# Patient Record
Sex: Female | Born: 1945 | Race: White | Hispanic: No | Marital: Married | Smoking: Never smoker
Health system: Southern US, Community
[De-identification: ages and names within clinical notes are randomized; demographics above are authoritative.]

## PROBLEM LIST (undated history)

## (undated) DIAGNOSIS — E0590 Thyrotoxicosis, unspecified without thyrotoxic crisis or storm: Secondary | ICD-10-CM

## (undated) DIAGNOSIS — N390 Urinary tract infection, site not specified: Secondary | ICD-10-CM

## (undated) DIAGNOSIS — T7840XA Allergy, unspecified, initial encounter: Secondary | ICD-10-CM

## (undated) DIAGNOSIS — H269 Unspecified cataract: Secondary | ICD-10-CM

## (undated) DIAGNOSIS — F419 Anxiety disorder, unspecified: Secondary | ICD-10-CM

## (undated) DIAGNOSIS — M1711 Unilateral primary osteoarthritis, right knee: Secondary | ICD-10-CM

## (undated) DIAGNOSIS — M810 Age-related osteoporosis without current pathological fracture: Secondary | ICD-10-CM

## (undated) DIAGNOSIS — E119 Type 2 diabetes mellitus without complications: Secondary | ICD-10-CM

## (undated) DIAGNOSIS — I10 Essential (primary) hypertension: Secondary | ICD-10-CM

## (undated) DIAGNOSIS — K219 Gastro-esophageal reflux disease without esophagitis: Secondary | ICD-10-CM

## (undated) DIAGNOSIS — M1990 Unspecified osteoarthritis, unspecified site: Secondary | ICD-10-CM

## (undated) HISTORY — DX: Allergy, unspecified, initial encounter: T78.40XA

## (undated) HISTORY — DX: Type 2 diabetes mellitus without complications: E11.9

## (undated) HISTORY — PX: ABDOMINAL HYSTERECTOMY: SHX81

## (undated) HISTORY — DX: Unspecified cataract: H26.9

## (undated) HISTORY — PX: EYE SURGERY: SHX253

## (undated) HISTORY — DX: Unspecified osteoarthritis, unspecified site: M19.90

## (undated) HISTORY — PX: TONSILLECTOMY: SUR1361

## (undated) HISTORY — DX: Age-related osteoporosis without current pathological fracture: M81.0

## (undated) HISTORY — DX: Essential (primary) hypertension: I10

---

## 1994-10-25 HISTORY — PX: KNEE SURGERY: SHX244

## 1996-10-25 HISTORY — PX: KNEE SURGERY: SHX244

## 1998-03-21 ENCOUNTER — Other Ambulatory Visit: Admission: RE | Admit: 1998-03-21 | Discharge: 1998-03-21 | Payer: Self-pay | Admitting: Obstetrics and Gynecology

## 1999-06-03 ENCOUNTER — Other Ambulatory Visit: Admission: RE | Admit: 1999-06-03 | Discharge: 1999-06-03 | Payer: Self-pay | Admitting: Obstetrics and Gynecology

## 1999-07-21 ENCOUNTER — Observation Stay (HOSPITAL_COMMUNITY): Admission: RE | Admit: 1999-07-21 | Discharge: 1999-07-22 | Payer: Self-pay | Admitting: Urology

## 2000-02-22 ENCOUNTER — Other Ambulatory Visit: Admission: RE | Admit: 2000-02-22 | Discharge: 2000-02-22 | Payer: Self-pay | Admitting: Obstetrics and Gynecology

## 2000-03-01 ENCOUNTER — Other Ambulatory Visit: Admission: RE | Admit: 2000-03-01 | Discharge: 2000-03-01 | Payer: Self-pay | Admitting: Obstetrics and Gynecology

## 2000-03-01 ENCOUNTER — Encounter (INDEPENDENT_AMBULATORY_CARE_PROVIDER_SITE_OTHER): Payer: Self-pay | Admitting: Specialist

## 2000-04-29 ENCOUNTER — Encounter (INDEPENDENT_AMBULATORY_CARE_PROVIDER_SITE_OTHER): Payer: Self-pay

## 2000-04-29 ENCOUNTER — Inpatient Hospital Stay (HOSPITAL_COMMUNITY): Admission: RE | Admit: 2000-04-29 | Discharge: 2000-05-01 | Payer: Self-pay | Admitting: Obstetrics and Gynecology

## 2001-03-29 ENCOUNTER — Other Ambulatory Visit: Admission: RE | Admit: 2001-03-29 | Discharge: 2001-03-29 | Payer: Self-pay | Admitting: Obstetrics and Gynecology

## 2001-07-11 ENCOUNTER — Encounter: Payer: Self-pay | Admitting: Orthopedic Surgery

## 2001-07-11 ENCOUNTER — Encounter: Admission: RE | Admit: 2001-07-11 | Discharge: 2001-07-11 | Payer: Self-pay | Admitting: Orthopedic Surgery

## 2002-08-23 ENCOUNTER — Encounter: Admission: RE | Admit: 2002-08-23 | Discharge: 2002-08-23 | Payer: Self-pay | Admitting: Family Medicine

## 2002-08-23 ENCOUNTER — Encounter: Payer: Self-pay | Admitting: Family Medicine

## 2003-11-28 ENCOUNTER — Encounter: Admission: RE | Admit: 2003-11-28 | Discharge: 2003-11-28 | Payer: Self-pay | Admitting: Internal Medicine

## 2003-12-11 ENCOUNTER — Other Ambulatory Visit: Admission: RE | Admit: 2003-12-11 | Discharge: 2003-12-11 | Payer: Self-pay | Admitting: Obstetrics and Gynecology

## 2005-08-31 ENCOUNTER — Other Ambulatory Visit: Admission: RE | Admit: 2005-08-31 | Discharge: 2005-08-31 | Payer: Self-pay | Admitting: Obstetrics and Gynecology

## 2005-09-20 ENCOUNTER — Encounter: Admission: RE | Admit: 2005-09-20 | Discharge: 2005-09-20 | Payer: Self-pay | Admitting: Obstetrics and Gynecology

## 2007-03-14 ENCOUNTER — Other Ambulatory Visit: Admission: RE | Admit: 2007-03-14 | Discharge: 2007-03-14 | Payer: Self-pay | Admitting: Obstetrics and Gynecology

## 2007-05-30 ENCOUNTER — Encounter: Admission: RE | Admit: 2007-05-30 | Discharge: 2007-05-30 | Payer: Self-pay | Admitting: Obstetrics and Gynecology

## 2007-06-01 ENCOUNTER — Encounter: Admission: RE | Admit: 2007-06-01 | Discharge: 2007-06-01 | Payer: Self-pay | Admitting: Obstetrics and Gynecology

## 2010-07-30 ENCOUNTER — Encounter: Admission: RE | Admit: 2010-07-30 | Discharge: 2010-07-30 | Payer: Self-pay | Admitting: Family Medicine

## 2011-03-12 NOTE — Op Note (Signed)
[facility]  Patient:    Sharon Ochoa, Sharon Ochoa                   MRN: [id number] Proc. Date: 04/29/00 Adm. Date:  [id number] Attending:  Wandalee Ferdinand                           Operative Report  PREOPERATIVE DIAGNOSES:       1. Pelvic relaxation.                               2. Rectocele.                               3. Endometrial hyperplasia without atypia.  POSTOPERATIVE DIAGNOSES:      1. Pelvic relaxation.                               2. Rectocele.                               3. Endometrial hyperplasia without atypia,                                  pathology pending.  OPERATION:                    1. Total vaginal hysterectomy.                               2. Posterior colporrhaphy.  SURGEON:                      Rudy Jew. Ashley Royalty, M.D.  ASSISTANT:                    Jamison Neighbor, M.D.  ESTIMATED BLOOD LOSS:         75 cc.  COMPLICATIONS:                None.  PACKS AND DRAINS:             1. Foley.                               2. Vaginal pack.  DESCRIPTION OF PROCEDURE:     The patient was taken to the operating room and placed in the dorsal supine position.  After adequate general endotracheal anesthesia was administered, she was placed in the lithotomy position and prepped and draped in the usual manner for vaginal surgery.  A Foley catheter was placed.  A posterior weighted retractor was placed, and the anterior lip of the cervix was grasped with a Jacobs tenaculum.  Approximately 10 cc of 1% Xylocaine with 1:100,000 epinephrine was instilled circumferentially around the cervix to aid in hemostasis.  The anterior aspect of the cervix was circumscribed with a knife.  A posterior colpotomy incision was successfully made.  The Steiner-Avard retractor was placed in the posterior cul-de-sac. The uterosacral ligaments were bilaterally clamped, cut, and secured with #1 chromic catgut.  Next, the bladder was  dissected  cephalad using sharp and blunt dissection and the anterior cul-de-sac successfully entered.  The cardinal ligaments were then clamped, cut, and secured with #1 chromic catgut. The uterine vessels were clamped, cut, and doubly secured with #1 chromic catgut.  One additional bite was taken on either side to further secure the parametrial tissues.  At this point, the posterior aspect of the corpus was grasped with a single-tooth tenaculum and the uterus inverted.  The pedicles consisting of the fallopian tube, utero-ovarian anastomoses, and round ligaments were clamped, cut, and doubly secured with #1 chromic catgut.  The specimen was submitted to pathology for histologic studies.  The pedicles were inspected and found to be hemostatic.  Next, a running locking 2-0 Vicryl suture was placed from 3 to 9 oclock posteriorly to aid in hemostasis of the vaginal cuff.  This was successfully placed.  Next, a modified McCall stitch was placed from uterosacral ligament to uterosacral ligament posteriorly on the peritoneum to further plicate the pelvic tissues and avoid enterocele formation.  Hemostasis was noted.  The area was thoroughly irrigated, then. The vagina was then closed anteriorly to posteriorly with #1 chromic catgut in a figure-of-eight fashion.  The hysterectomy portion of the procedure was thus terminated.  Attention was then turned to the rectocele.  First the operator placed his finger in the anus and failed to detect any evidence of enterocele.  The introitus was gauged, and a transverse incision was made along the posterior fourchette.  The posterior aspect of the vagina was undermined in the midline and incised cephalad up to nearly the level of the vaginal cuff.  The vaginal mucosa was sharply and bluntly dissected free from the underlying pararectal fascia.  The dissection was carried to within approximately 1 cm of the vaginal cuff.  Next, the rectum was plicated using transverse  pursestring sutures of 2-0 Vicryl.  An excellent plication of the rectum was noted to have been achieved.  Excess vaginal mucosa was then excised.  The vaginal mucosa was then closed in running locking fashion, catching a small amount of pararectal fascia to aid in closure of dead space.  At the level of the introitus, the area was closed like an episiotomy to reduce the diameter of the introitus.  Hemostasis was noted.  At the conclusion of the procedure, approximately three of the operators fingers were easily presentable into the introitus.  Hemostasis was noted.  With the Foley catheter in place, a two-inch iodoform gauze was placed in the vagina to aid in postoperative hemostasis.  This will presumably be removed the first postoperative day.  The patient tolerated the procedure extremely well and was returned to the recovery room in good condition. DD:  04/29/00 TD:  04/29/00 Job: [id number]

## 2011-03-12 NOTE — H&P (Signed)
[facility]  Patient:    Sharon Ochoa, Sharon Ochoa                         MRN: [id number] Adm. Date:  04/28/00 Attending:  Fayrene Fearing A. Ashley Royalty, M.D.                         History and Physical  HISTORY OF PRESENT ILLNESS:   This is a 65 year old, gravida 4, para 4, who presented through the courtesy of her urologist, Eudelia Bunch for increasing symptoms of uterine prolapse.  She had a pubovaginal sling procedure as well as anterior colporrhaphy by Dr. Marcelyn Bruins previously.  At that time she wanted to keep her uterus and thus the pubovaginal procedure only was performed.  However, over time she has developed increasing symptoms of uterine prolapse and request surgical intervention.  Endometrial biopsy was performed Feb 29, 2000, and revealed endometrial hyperplasia, simple without atypia.  The patient was placed on Megace 20 mg daily.  MEDICATIONS:                  Relafen, nitrofurantoin, calcium.  PAST MEDICAL HISTORY:         Multinodular goiter with normal thyroid function, followed by Dr. Lucianne Muss.  Surgical:  T&A, D&C, left knee arthroscopic surgery, right knee, treatment of chondroplasia.  Anterior colporrhaphy and pubovaginal sling procedure September of 2000.  ALLERGIES:                    BACTRIM, ERYTHROMYCIN.  FAMILY HISTORY:               Positive for diabetes and coronary artery disease.  SOCIAL HISTORY:               The patient denies the use of tobacco or significant alcohol.  She is an [employer] fund[employer].  She is married.  REVIEW OF SYSTEMS:            Noncontributory.  PHYSICAL EXAMINATION:  GENERAL:                      A well-developed, well-nourished, pleasant white female in no acute distress.  VITAL SIGNS:                  Afebrile, vital signs stable.  SKIN:                         Warm and dry without lesions.  LYMPH NODES:                  There is no subclavicular, cervical or inguinal adenopathy.  HEENT:                         Normocephalic.  NECK:                         Supple without thyromegaly.  CHEST:                        Lungs are clear.  CARDIAC:                      Regular rate and rhythm without murmurs, gallops, or rubs.  BREASTS:  Examination deferred.  ABDOMEN:                      Soft and nontender without masses or organomegaly.  Bowel sounds are active.  MUSCULOSKELETAL:              Examination reveals full range of motion without edema, cyanosis, or CVA tenderness.  PELVIC:                       External genitalia within normal limits.  With Valsalva maneuver the cervix is visualized flushed with the level of the vulva.  Bimanual examination reveals uterus to be approximately 9 x 5 x 4 cm. No adnexal masses are palpable.  There is little if any cystocele present and a moderate retrocele.  There is no enterocele.  Rectovaginal examination confirms.  IMPRESSION:                   1. Pelvic relaxation with no significant stress                                  urinary incontinence.                               2. Rectocele.                               3. Status post pubovaginal sling and anterior                                  colporrhaphy per Dr. Logan Bores.  PLAN:                         Total vaginal hysterectomy with posterior colporrhaphy.  Risks, benefits and alternatives were fully discussed with the patient.  Questions invited and answered. DD:  04/28/00 TD:  04/28/00 Job: [id number]

## 2011-07-15 ENCOUNTER — Encounter: Payer: Self-pay | Admitting: Family Medicine

## 2011-07-15 ENCOUNTER — Ambulatory Visit (INDEPENDENT_AMBULATORY_CARE_PROVIDER_SITE_OTHER): Payer: Medicare Other | Admitting: Family Medicine

## 2011-07-15 VITALS — BP 146/89 | HR 74

## 2011-07-15 DIAGNOSIS — M2141 Flat foot [pes planus] (acquired), right foot: Secondary | ICD-10-CM

## 2011-07-15 DIAGNOSIS — M2140 Flat foot [pes planus] (acquired), unspecified foot: Secondary | ICD-10-CM

## 2011-07-15 NOTE — Progress Notes (Signed)
  Subjective:    Patient ID: Sharon Ochoa, female    DOB: October 12, 1946, 65 y.o.   MRN: [id number]  HPI Sharon Ochoa was complaining of left dorsal foot pain about 4 weeks ago. She was seen by Dr. Thurston Hole who recommended her to have orthotics made. Her foot pain has improve and she is pain free at this point. She would like to be evaluated and try orthotics if necessary. She has also been having B/L knee pain as well but she is asymptomatic from her knees at this visit.  There is no problem list on file for this patient. No current outpatient prescriptions on file prior to visit.   Allergies  Allergen Reactions  . Bactrim   . Erythromycin   . Macrodantin         Review of Systems  Constitutional: Negative for fever, chills and fatigue.  Musculoskeletal: Negative for back pain, joint swelling and gait problem.       Left foot pain about 1 month ago, now resolved  Neurological: Negative for tremors, weakness and numbness.       Objective:   Physical Exam  Constitutional: She appears well-developed and well-nourished.       BP 146/89  Pulse 74   Pulmonary/Chest: Effort normal.  Musculoskeletal:       Feet with intact skin. No swelling no hematomas. No TTP. Mild Flat feet .  No plantar callus. sensation intact distally. Gait independent without a limp.  Neurological: She is alert.  Skin: Skin is warm. No rash noted. No erythema. No pallor.  Psychiatric: She has a normal mood and affect.       Assessment & Plan:   1. Pes planus (flat feet)   sport  Insoles Scaphoid pads F/u in 6 weeks. If she feels that there is a benefit with the sport insoles and the scaphoid pads we recommend custom orthotics.

## 2012-03-16 DIAGNOSIS — H5210 Myopia, unspecified eye: Secondary | ICD-10-CM | POA: Insufficient documentation

## 2012-03-16 DIAGNOSIS — Z961 Presence of intraocular lens: Secondary | ICD-10-CM | POA: Insufficient documentation

## 2012-03-16 DIAGNOSIS — H11829 Conjunctivochalasis, unspecified eye: Secondary | ICD-10-CM | POA: Diagnosis present

## 2012-03-16 DIAGNOSIS — H02839 Dermatochalasis of unspecified eye, unspecified eyelid: Secondary | ICD-10-CM | POA: Insufficient documentation

## 2012-05-08 ENCOUNTER — Other Ambulatory Visit: Payer: Self-pay | Admitting: Family Medicine

## 2012-05-08 DIAGNOSIS — Z1231 Encounter for screening mammogram for malignant neoplasm of breast: Secondary | ICD-10-CM

## 2012-05-26 ENCOUNTER — Ambulatory Visit: Payer: No Typology Code available for payment source

## 2012-05-30 ENCOUNTER — Ambulatory Visit
Admission: RE | Admit: 2012-05-30 | Discharge: 2012-05-30 | Disposition: A | Discharge status: Home / Self Care | Payer: Medicare Other | Source: Ambulatory Visit | Attending: Family Medicine | Admitting: Family Medicine

## 2012-05-30 DIAGNOSIS — Z1231 Encounter for screening mammogram for malignant neoplasm of breast: Secondary | ICD-10-CM

## 2013-01-06 ENCOUNTER — Ambulatory Visit (INDEPENDENT_AMBULATORY_CARE_PROVIDER_SITE_OTHER): Payer: Medicare Other | Admitting: Emergency Medicine

## 2013-01-06 VITALS — BP 132/88 | HR 95 | Temp 98.7°F | Resp 17 | Ht 67.0 in | Wt 238.0 lb

## 2013-01-06 DIAGNOSIS — J020 Streptococcal pharyngitis: Secondary | ICD-10-CM

## 2013-01-06 MED ORDER — PENICILLIN V POTASSIUM 500 MG PO TABS: 500.0000 mg | ORAL_TABLET | Freq: Four times a day (QID) | ORAL | Status: DC

## 2013-01-06 NOTE — Progress Notes (Signed)
[facility] [address] [place] [address] [phone]  Date:  01/06/2013   Name:  Sharon Ochoa   DOB:  08/01/1946   MRN:  [id number]  PCP:  Pamelia Hoit, MD    Chief Complaint: Sore Throat   History of Present Illness:  Sharon Ochoa is a 67 y.o. very pleasant female patient who presents with the following:  Ill for four days with sore throat and fever.  Painful to swallow.  No trouble handling secretions.  No rash or sepsis.  No cough or coryza, nausea or vomiting, or stool change.  No improvement with over the counter medications or other home remedies. Denies other complaint or health concern today.   There is no problem list on file for this patient.   Past Medical History  Diagnosis Date  . Allergy   . Arthritis   . Cataract   . Diabetes mellitus without complication   . Hypertension   . Osteoporosis     Past Surgical History  Procedure Laterality Date  . Eye surgery    . Abdominal hysterectomy      History  Substance Use Topics  . Smoking status: Never Smoker   . Smokeless tobacco: Never Used  . Alcohol Use: No    Family History  Problem Relation Age of Onset  . Osteoporosis Mother   . Heart disease Father   . Osteoporosis Sister   . Ulcers Brother   . Asthma Brother   . Diabetes Maternal Grandmother     Allergies  Allergen Reactions  . Sulfa Antibiotics Other (See Comments)    Cramps, fever, chills   . Bactrim   . Erythromycin   . Macrodantin     Medication list has been reviewed and updated.  Current Outpatient Prescriptions on File Prior to Visit  Medication Sig Dispense Refill  . meloxicam (MOBIC) 15 MG tablet Take 15 mg by mouth daily.      Marland Kitchen omeprazole (PRILOSEC) 20 MG capsule Take 20 mg by mouth daily.      Marland Kitchen neomycin-polymyxin-hydrocortisone (CORTISPORIN) 3.5-10000-1 otic suspension        No current facility-administered medications on file prior to visit.    Review of Systems:  As per HPI,  otherwise negative.    Physical Examination: Filed Vitals:   01/06/13 1707  BP: 132/88  Pulse: 95  Temp: 98.7 F (37.1 C)  Resp: 17   Filed Vitals:   01/06/13 1707  Height: 5\' 7"  (1.702 m)  Weight: 238 lb (107.956 kg)   Body mass index is 37.27 kg/(m^2). Ideal Body Weight: Weight in (lb) to have BMI = 25: 159.3  GEN: WDWN, NAD, Non-toxic, A & O x 3 HEENT: Atraumatic, Normocephalic. Neck supple. No masses, No LAD.  Very erythematous throat. Ears and Nose: No external deformity. CV: RRR, No M/G/R. No JVD. No thrill. No extra heart sounds. PULM: CTA B, no wheezes, crackles, rhonchi. No retractions. No resp. distress. No accessory muscle use. ABD: S, NT, ND, +BS. No rebound. No HSM. EXTR: No c/c/e NEURO Normal gait.  PSYCH: Normally interactive. Conversant. Not depressed or anxious appearing.  Calm demeanor.    Assessment and Plan: Strep throat Pen Elon Jester, MD

## 2013-01-06 NOTE — Patient Instructions (Addendum)
Strep Infections  Streptococcal (strep) infections are caused by streptococcal germs (bacteria). Strep infections are very contagious. Strep infections can occur in:   Ears.   The nose.   The throat.   Sinuses.   Skin.   Blood.   Lungs.   Spinal fluid.   Urine.  Strep throat is the most common bacterial infection in children. The symptoms of a Strep infection usually get better in 2 to 3 days after starting medicine that kills germs (antibiotics). Strep is usually not contagious after 36 to 48 hours of antibiotic treatment. Strep infections that are not treated can cause serious complications. These include gland infections, throat abscess, rheumatic fever and kidney disease.  DIAGNOSIS   The diagnosis of strep is made by:   A culture for the strep germ.  TREATMENT   These infections require oral antibiotics for a full 10 days, an antibiotic shot or antibiotics given into the vein (intravenous, IV).  HOME CARE INSTRUCTIONS    Be sure to finish all antibiotics even if feeling better.   Only take over-the-counter medicines for pain, discomfort and or fever, as directed by your caregiver.   Close contacts that have a fever, sore throat or illness symptoms should see their caregiver right away.   You or your child may return to work, school or daycare if the fever and pain are better in 2 to 3 days after starting antibiotics.  SEEK MEDICAL CARE IF:    You or your child has an oral temperature above 102 F (38.9 C).   Your baby is older than 3 months with a rectal temperature of 100.5 F (38.1 C) or higher for more than 1 day.   You or your child is not better in 3 days.  SEEK IMMEDIATE MEDICAL CARE IF:    You or your child has an oral temperature above 102 F (38.9 C), not controlled by medicine.   Your baby is older than 3 months with a rectal temperature of 102 F (38.9 C) or higher.   Your baby is 3 months old or younger with a rectal temperature of 100.4 F (38 C) or higher.   There is a  spreading rash.   There is difficulty swallowing or breathing.   There is increased pain or swelling.  Document Released: 11/18/2004 Document Revised: 01/03/2012 Document Reviewed: 08/27/2009  ExitCare Patient Information 2013 [employer].

## 2013-10-25 DIAGNOSIS — E0590 Thyrotoxicosis, unspecified without thyrotoxic crisis or storm: Secondary | ICD-10-CM

## 2013-10-25 HISTORY — DX: Thyrotoxicosis, unspecified without thyrotoxic crisis or storm: E05.90

## 2014-04-02 ENCOUNTER — Other Ambulatory Visit (HOSPITAL_COMMUNITY): Payer: Self-pay | Admitting: Endocrinology

## 2014-04-02 DIAGNOSIS — E0590 Thyrotoxicosis, unspecified without thyrotoxic crisis or storm: Secondary | ICD-10-CM

## 2014-04-03 ENCOUNTER — Other Ambulatory Visit (HOSPITAL_COMMUNITY): Payer: Self-pay | Admitting: Endocrinology

## 2014-04-03 DIAGNOSIS — E0590 Thyrotoxicosis, unspecified without thyrotoxic crisis or storm: Secondary | ICD-10-CM

## 2014-04-18 ENCOUNTER — Encounter (HOSPITAL_COMMUNITY)
Admission: RE | Admit: 2014-04-18 | Discharge: 2014-04-18 | Disposition: A | Discharge status: Home / Self Care | Payer: Medicare Other | Source: Ambulatory Visit | Attending: Endocrinology | Admitting: Endocrinology

## 2014-04-18 DIAGNOSIS — E0590 Thyrotoxicosis, unspecified without thyrotoxic crisis or storm: Secondary | ICD-10-CM | POA: Insufficient documentation

## 2014-04-19 ENCOUNTER — Encounter (HOSPITAL_COMMUNITY)
Admission: RE | Admit: 2014-04-19 | Discharge: 2014-04-19 | Disposition: A | Discharge status: Home / Self Care | Payer: Medicare Other | Source: Ambulatory Visit | Attending: Endocrinology | Admitting: Endocrinology

## 2014-04-19 DIAGNOSIS — E0590 Thyrotoxicosis, unspecified without thyrotoxic crisis or storm: Secondary | ICD-10-CM | POA: Insufficient documentation

## 2014-04-19 MED ORDER — SODIUM PERTECHNETATE TC 99M INJECTION
11.0000 | Freq: Once | INTRAVENOUS | Status: AC | PRN
  Administered 2014-04-19: 11 via INTRAVENOUS

## 2014-04-19 MED ORDER — SODIUM IODIDE I 131 CAPSULE
11.6000 | Freq: Once | INTRAVENOUS | Status: AC | PRN
  Administered 2014-04-18: 11.6 via ORAL

## 2014-05-08 ENCOUNTER — Other Ambulatory Visit: Payer: Self-pay | Admitting: Endocrinology

## 2014-05-08 DIAGNOSIS — E0590 Thyrotoxicosis, unspecified without thyrotoxic crisis or storm: Secondary | ICD-10-CM

## 2014-05-16 ENCOUNTER — Ambulatory Visit (HOSPITAL_COMMUNITY)
Admission: RE | Admit: 2014-05-16 | Discharge: 2014-05-16 | Disposition: A | Discharge status: Home / Self Care | Payer: Medicare Other | Source: Ambulatory Visit | Attending: Endocrinology | Admitting: Endocrinology

## 2014-05-16 DIAGNOSIS — E0590 Thyrotoxicosis, unspecified without thyrotoxic crisis or storm: Secondary | ICD-10-CM | POA: Diagnosis present

## 2014-05-16 MED ORDER — SODIUM IODIDE I 131 CAPSULE
20.4000 | Freq: Once | INTRAVENOUS | Status: AC | PRN
  Administered 2014-05-16: 20.4 via ORAL

## 2014-06-11 DIAGNOSIS — E119 Type 2 diabetes mellitus without complications: Secondary | ICD-10-CM

## 2014-11-04 DIAGNOSIS — J22 Unspecified acute lower respiratory infection: Secondary | ICD-10-CM | POA: Diagnosis not present

## 2014-11-04 DIAGNOSIS — I10 Essential (primary) hypertension: Secondary | ICD-10-CM | POA: Diagnosis not present

## 2014-11-29 DIAGNOSIS — E0590 Thyrotoxicosis, unspecified without thyrotoxic crisis or storm: Secondary | ICD-10-CM | POA: Diagnosis not present

## 2014-12-03 DIAGNOSIS — E0590 Thyrotoxicosis, unspecified without thyrotoxic crisis or storm: Secondary | ICD-10-CM | POA: Diagnosis not present

## 2014-12-17 ENCOUNTER — Other Ambulatory Visit: Payer: Self-pay | Admitting: Family Medicine

## 2014-12-17 DIAGNOSIS — M8580 Other specified disorders of bone density and structure, unspecified site: Secondary | ICD-10-CM

## 2014-12-17 DIAGNOSIS — Z780 Asymptomatic menopausal state: Secondary | ICD-10-CM

## 2014-12-17 DIAGNOSIS — Z1231 Encounter for screening mammogram for malignant neoplasm of breast: Secondary | ICD-10-CM

## 2014-12-19 DIAGNOSIS — E119 Type 2 diabetes mellitus without complications: Secondary | ICD-10-CM | POA: Diagnosis not present

## 2014-12-19 DIAGNOSIS — M1990 Unspecified osteoarthritis, unspecified site: Secondary | ICD-10-CM | POA: Diagnosis not present

## 2014-12-19 DIAGNOSIS — F439 Reaction to severe stress, unspecified: Secondary | ICD-10-CM | POA: Diagnosis not present

## 2014-12-19 DIAGNOSIS — E785 Hyperlipidemia, unspecified: Secondary | ICD-10-CM | POA: Diagnosis not present

## 2014-12-20 ENCOUNTER — Ambulatory Visit
Admission: RE | Admit: 2014-12-20 | Discharge: 2014-12-20 | Disposition: A | Discharge status: Home / Self Care | Payer: Medicare Other | Source: Ambulatory Visit | Attending: Family Medicine | Admitting: Family Medicine

## 2014-12-20 DIAGNOSIS — M8580 Other specified disorders of bone density and structure, unspecified site: Secondary | ICD-10-CM

## 2014-12-20 DIAGNOSIS — M85852 Other specified disorders of bone density and structure, left thigh: Secondary | ICD-10-CM | POA: Diagnosis not present

## 2014-12-20 DIAGNOSIS — M8588 Other specified disorders of bone density and structure, other site: Secondary | ICD-10-CM | POA: Diagnosis not present

## 2014-12-20 DIAGNOSIS — Z1231 Encounter for screening mammogram for malignant neoplasm of breast: Secondary | ICD-10-CM | POA: Diagnosis not present

## 2014-12-20 DIAGNOSIS — Z780 Asymptomatic menopausal state: Secondary | ICD-10-CM

## 2014-12-23 ENCOUNTER — Other Ambulatory Visit: Payer: Self-pay | Admitting: Family Medicine

## 2014-12-23 DIAGNOSIS — R928 Other abnormal and inconclusive findings on diagnostic imaging of breast: Secondary | ICD-10-CM

## 2014-12-27 ENCOUNTER — Ambulatory Visit
Admission: RE | Admit: 2014-12-27 | Discharge: 2014-12-27 | Disposition: A | Discharge status: Home / Self Care | Payer: Medicare Other | Source: Ambulatory Visit | Attending: Family Medicine | Admitting: Family Medicine

## 2014-12-27 DIAGNOSIS — R928 Other abnormal and inconclusive findings on diagnostic imaging of breast: Secondary | ICD-10-CM

## 2014-12-27 DIAGNOSIS — N63 Unspecified lump in breast: Secondary | ICD-10-CM | POA: Diagnosis not present

## 2014-12-27 DIAGNOSIS — R921 Mammographic calcification found on diagnostic imaging of breast: Secondary | ICD-10-CM | POA: Diagnosis not present

## 2014-12-31 ENCOUNTER — Other Ambulatory Visit: Payer: Medicare Other

## 2015-01-14 DIAGNOSIS — E0590 Thyrotoxicosis, unspecified without thyrotoxic crisis or storm: Secondary | ICD-10-CM | POA: Diagnosis not present

## 2015-01-31 DIAGNOSIS — E0590 Thyrotoxicosis, unspecified without thyrotoxic crisis or storm: Secondary | ICD-10-CM | POA: Diagnosis not present

## 2015-03-04 DIAGNOSIS — M170 Bilateral primary osteoarthritis of knee: Secondary | ICD-10-CM | POA: Diagnosis not present

## 2015-06-04 DIAGNOSIS — E0590 Thyrotoxicosis, unspecified without thyrotoxic crisis or storm: Secondary | ICD-10-CM | POA: Diagnosis not present

## 2015-06-18 ENCOUNTER — Other Ambulatory Visit: Payer: Self-pay | Admitting: Family Medicine

## 2015-06-18 DIAGNOSIS — N63 Unspecified lump in unspecified breast: Secondary | ICD-10-CM

## 2015-06-24 DIAGNOSIS — Z23 Encounter for immunization: Secondary | ICD-10-CM | POA: Diagnosis not present

## 2015-06-24 DIAGNOSIS — R5383 Other fatigue: Secondary | ICD-10-CM | POA: Diagnosis not present

## 2015-06-24 DIAGNOSIS — R0602 Shortness of breath: Secondary | ICD-10-CM | POA: Diagnosis not present

## 2015-06-24 DIAGNOSIS — E785 Hyperlipidemia, unspecified: Secondary | ICD-10-CM | POA: Diagnosis not present

## 2015-06-24 DIAGNOSIS — E041 Nontoxic single thyroid nodule: Secondary | ICD-10-CM | POA: Diagnosis not present

## 2015-06-24 DIAGNOSIS — E119 Type 2 diabetes mellitus without complications: Secondary | ICD-10-CM | POA: Diagnosis not present

## 2015-06-24 DIAGNOSIS — I10 Essential (primary) hypertension: Secondary | ICD-10-CM | POA: Diagnosis not present

## 2015-07-01 ENCOUNTER — Ambulatory Visit
Admission: RE | Admit: 2015-07-01 | Discharge: 2015-07-01 | Disposition: A | Discharge status: Home / Self Care | Payer: Medicare Other | Source: Ambulatory Visit | Attending: Family Medicine | Admitting: Family Medicine

## 2015-07-01 DIAGNOSIS — N63 Unspecified lump in unspecified breast: Secondary | ICD-10-CM

## 2015-07-07 DIAGNOSIS — E0590 Thyrotoxicosis, unspecified without thyrotoxic crisis or storm: Secondary | ICD-10-CM | POA: Diagnosis not present

## 2015-08-18 DIAGNOSIS — E1165 Type 2 diabetes mellitus with hyperglycemia: Secondary | ICD-10-CM | POA: Diagnosis not present

## 2015-08-18 DIAGNOSIS — I10 Essential (primary) hypertension: Secondary | ICD-10-CM | POA: Diagnosis not present

## 2015-08-18 DIAGNOSIS — E0590 Thyrotoxicosis, unspecified without thyrotoxic crisis or storm: Secondary | ICD-10-CM | POA: Diagnosis not present

## 2015-08-20 DIAGNOSIS — H02836 Dermatochalasis of left eye, unspecified eyelid: Secondary | ICD-10-CM | POA: Diagnosis not present

## 2015-08-20 DIAGNOSIS — H52203 Unspecified astigmatism, bilateral: Secondary | ICD-10-CM | POA: Diagnosis not present

## 2015-08-20 DIAGNOSIS — Z961 Presence of intraocular lens: Secondary | ICD-10-CM | POA: Diagnosis not present

## 2015-08-20 DIAGNOSIS — H11823 Conjunctivochalasis, bilateral: Secondary | ICD-10-CM | POA: Diagnosis not present

## 2015-08-20 DIAGNOSIS — H5213 Myopia, bilateral: Secondary | ICD-10-CM | POA: Diagnosis not present

## 2015-08-20 DIAGNOSIS — H02833 Dermatochalasis of right eye, unspecified eyelid: Secondary | ICD-10-CM | POA: Diagnosis not present

## 2015-08-20 DIAGNOSIS — E119 Type 2 diabetes mellitus without complications: Secondary | ICD-10-CM | POA: Diagnosis not present

## 2015-09-01 DIAGNOSIS — M170 Bilateral primary osteoarthritis of knee: Secondary | ICD-10-CM | POA: Diagnosis not present

## 2015-09-05 DIAGNOSIS — Z23 Encounter for immunization: Secondary | ICD-10-CM | POA: Diagnosis not present

## 2015-10-02 DIAGNOSIS — E0590 Thyrotoxicosis, unspecified without thyrotoxic crisis or storm: Secondary | ICD-10-CM | POA: Diagnosis not present

## 2015-10-02 DIAGNOSIS — E1165 Type 2 diabetes mellitus with hyperglycemia: Secondary | ICD-10-CM | POA: Diagnosis not present

## 2015-10-23 ENCOUNTER — Encounter: Payer: Self-pay | Admitting: *Deleted

## 2015-10-23 ENCOUNTER — Encounter: Payer: Medicare Other | Attending: Endocrinology | Admitting: *Deleted

## 2015-10-23 DIAGNOSIS — Z713 Dietary counseling and surveillance: Secondary | ICD-10-CM | POA: Insufficient documentation

## 2015-10-23 DIAGNOSIS — Z794 Long term (current) use of insulin: Secondary | ICD-10-CM | POA: Diagnosis not present

## 2015-10-23 DIAGNOSIS — E1165 Type 2 diabetes mellitus with hyperglycemia: Secondary | ICD-10-CM

## 2015-10-23 DIAGNOSIS — Z7984 Long term (current) use of oral hypoglycemic drugs: Secondary | ICD-10-CM | POA: Diagnosis not present

## 2015-10-23 DIAGNOSIS — E119 Type 2 diabetes mellitus without complications: Secondary | ICD-10-CM | POA: Diagnosis not present

## 2015-10-23 DIAGNOSIS — E118 Type 2 diabetes mellitus with unspecified complications: Secondary | ICD-10-CM

## 2015-10-30 NOTE — Progress Notes (Signed)
Diabetes Self-Management Education  Visit Type: First/Initial  Appt. Start Time: 0900 Appt. End Time: 1030  10/30/2015  [name], identified by name and date of birth, is a 70 y.o. female with a diagnosis of Diabetes: Type 2.   ASSESSMENT  There were no vitals taken for this visit. There is no weight on file to calculate BMI.      Diabetes Self-Management Education - 10/23/15 0926    Visit Information   Visit Type (p) First/Initial      Individualized Plan for Diabetes Self-Management Training:   Learning Objective:  Patient will have a greater understanding of diabetes self-management. Patient education plan is to attend individual and/or group sessions per assessed needs and concerns.   Plan:   Patient Instructions  Plan:  Aim for 2-3 Carb Choices per meal (30-45 grams)  Aim for 0-1 Carbs per snack if hungry  Include protein in moderation with your meals and snacks Consider reading food labels for Total Carbohydrate and Fat Grams of foods Continue with your activity level minutes daily as tolerated Consider checking BG at alternate times per day as directed by MD  Continue taking medication as directed by MD      Expected Outcomes:     Education material provided: Living Well with Diabetes, A1C conversion sheet, Meal plan card and Carbohydrate counting sheet  If problems or questions, patient to contact team via:  Phone and Email  Future DSME appointment: PRN

## 2015-10-30 NOTE — Patient Instructions (Signed)
Plan:  Aim for 2-3 Carb Choices per meal (30-45 grams)  Aim for 0-1 Carbs per snack if hungry  Include protein in moderation with your meals and snacks Consider reading food labels for Total Carbohydrate and Fat Grams of foods Continue with your activity level minutes daily as tolerated Consider checking BG at alternate times per day as directed by MD  Continue taking medication as directed by MD

## 2015-11-25 DIAGNOSIS — M170 Bilateral primary osteoarthritis of knee: Secondary | ICD-10-CM | POA: Diagnosis not present

## 2015-12-10 DIAGNOSIS — I10 Essential (primary) hypertension: Secondary | ICD-10-CM | POA: Diagnosis not present

## 2015-12-10 DIAGNOSIS — E0590 Thyrotoxicosis, unspecified without thyrotoxic crisis or storm: Secondary | ICD-10-CM | POA: Diagnosis not present

## 2015-12-10 DIAGNOSIS — E1165 Type 2 diabetes mellitus with hyperglycemia: Secondary | ICD-10-CM | POA: Diagnosis not present

## 2015-12-15 DIAGNOSIS — E7800 Pure hypercholesterolemia, unspecified: Secondary | ICD-10-CM | POA: Diagnosis not present

## 2015-12-15 DIAGNOSIS — Z0000 Encounter for general adult medical examination without abnormal findings: Secondary | ICD-10-CM | POA: Diagnosis not present

## 2015-12-15 DIAGNOSIS — E559 Vitamin D deficiency, unspecified: Secondary | ICD-10-CM | POA: Diagnosis not present

## 2015-12-15 DIAGNOSIS — E119 Type 2 diabetes mellitus without complications: Secondary | ICD-10-CM | POA: Diagnosis not present

## 2015-12-15 DIAGNOSIS — R5383 Other fatigue: Secondary | ICD-10-CM | POA: Diagnosis not present

## 2016-01-28 DIAGNOSIS — L298 Other pruritus: Secondary | ICD-10-CM | POA: Diagnosis not present

## 2016-01-28 DIAGNOSIS — B373 Candidiasis of vulva and vagina: Secondary | ICD-10-CM | POA: Diagnosis not present

## 2016-01-28 DIAGNOSIS — R350 Frequency of micturition: Secondary | ICD-10-CM | POA: Diagnosis not present

## 2016-01-28 DIAGNOSIS — N3000 Acute cystitis without hematuria: Secondary | ICD-10-CM | POA: Diagnosis not present

## 2016-02-04 ENCOUNTER — Encounter (HOSPITAL_COMMUNITY): Payer: Self-pay | Admitting: Physician Assistant

## 2016-02-04 DIAGNOSIS — M1711 Unilateral primary osteoarthritis, right knee: Secondary | ICD-10-CM | POA: Diagnosis present

## 2016-02-04 DIAGNOSIS — M1990 Unspecified osteoarthritis, unspecified site: Secondary | ICD-10-CM | POA: Diagnosis present

## 2016-02-04 DIAGNOSIS — F419 Anxiety disorder, unspecified: Secondary | ICD-10-CM | POA: Diagnosis present

## 2016-02-04 DIAGNOSIS — T7840XA Allergy, unspecified, initial encounter: Secondary | ICD-10-CM | POA: Diagnosis present

## 2016-02-04 DIAGNOSIS — I10 Essential (primary) hypertension: Secondary | ICD-10-CM | POA: Diagnosis present

## 2016-02-04 NOTE — H&P (Signed)
TOTAL KNEE ADMISSION H&P  Patient is being admitted for right total knee arthroplasty.  Subjective:  Chief Complaint:right knee pain.  HPI: Sharon Ochoa, 70 y.o. female, has a history of pain and functional disability in the right knee due to arthritis and has failed non-surgical conservative treatments for greater than 12 weeks to includeNSAID's and/or analgesics, corticosteriod injections, viscosupplementation injections, flexibility and strengthening excercises, supervised PT with diminished ADL's post treatment, use of assistive devices and activity modification.  Onset of symptoms was gradual, starting 10 years ago with gradually worsening course since that time. The patient noted prior procedures on the knee to include  arthroscopy and menisectomy on the right knee(s).  Patient currently rates pain in the right knee(s) at 10 out of 10 with activity. Patient has night pain, worsening of pain with activity and weight bearing, pain that interferes with activities of daily living, crepitus and joint swelling.  Patient has evidence of subchondral sclerosis, periarticular osteophytes and joint space narrowing by imaging studies. There is no active infection.  Patient Active Problem List   Diagnosis Date Noted  . Allergy   . Arthritis   . Hypertension   . Anxiety   . Primary localized osteoarthritis of right knee   . 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 Error, refractive, myopia 03/16/2012  . [place] 03/16/2012   Past Medical History  Diagnosis Date  . Allergy   . Arthritis   . Cataract   . Diabetes mellitus without complication ([place])   . Hypertension   . Osteoporosis   . Anxiety   . Primary localized osteoarthritis of right knee     Past Surgical History  Procedure Laterality Date  . Eye surgery    . Abdominal hysterectomy      No prescriptions prior to admission   Allergies   Allergen Reactions  . Sulfa Antibiotics Other (See Comments)    Cramps, fever, chills   . Bactrim   . Erythromycin   . Macrodantin     Social History  Substance Use Topics  . Smoking status: Never Smoker   . Smokeless tobacco: Never Used  . Alcohol Use: No    Family History  Problem Relation Age of Onset  . Osteoporosis Mother   . Heart disease Father   . Osteoporosis Sister   . Ulcers Brother   . Asthma Brother   . Diabetes Maternal Grandmother      Review of Systems  Constitutional: Negative.   HENT: Negative.   Eyes: Negative.   Respiratory: Negative.   Cardiovascular: Negative.   Gastrointestinal: Negative.   Genitourinary: Negative.   Musculoskeletal: Positive for back pain and joint pain.  Skin: Negative.   Neurological: Negative.   Endo/Heme/Allergies: Negative.   Psychiatric/Behavioral: Negative.     Objective:  Physical Exam  Constitutional: She is oriented to person, place, and time. She appears well-developed and well-nourished.  HENT:  Head: Normocephalic and atraumatic.  Mouth/Throat: Oropharynx is clear and moist.  Eyes: Conjunctivae are normal. Pupils are equal, round, and reactive to light.  Neck: Neck supple.  Cardiovascular: Normal rate, regular rhythm and normal heart sounds.   Respiratory: Effort normal and breath sounds normal.  GI: Bowel sounds are normal.  Genitourinary:  Not pertinent to current symptomatology therefore not examined.  Musculoskeletal:   Examination of both knees reveal significant pain bilaterally.  2+ crepitation.  2+ synovitis. Range of motion 0-120 degrees.  Varus deformity Knees are  stable with normal patella tracking.  Vascular exam: Pulses are 2+ and symmetric.    Neurological: She is alert and oriented to person, place, and time.  Skin: Skin is warm and dry.  Psychiatric: She has a normal mood and affect. Her behavior is normal.    Vital signs in last 24 hours:    Labs:   Estimated body mass index is  37.27 kg/(m^2) as calculated from the following:   Height as of 01/06/13: 5\' 7"  (1.702 m).   Weight as of 01/06/13: 107.956 kg (238 lb).   Imaging Review Plain radiographs demonstrate severe degenerative joint disease of the right knee(s). The overall alignment ismild varus. The bone quality appears to be good for age and reported activity level.  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 The treatment options including medical management, injection therapy arthroscopy and arthroplasty were discussed at length. The risks and benefits of total knee arthroplasty were presented and reviewed. The risks due to aseptic loosening, infection, stiffness, patella tracking problems, thromboembolic complications and other imponderables were discussed. The patient acknowledged the explanation, agreed to proceed with the plan and consent was signed. Patient is being admitted for inpatient treatment for surgery, pain control, PT, OT, prophylactic antibiotics, VTE prophylaxis, progressive ambulation and ADL's and discharge planning. The patient is planning to be discharged home with home health services

## 2016-02-10 DIAGNOSIS — M1711 Unilateral primary osteoarthritis, right knee: Secondary | ICD-10-CM | POA: Diagnosis not present

## 2016-02-10 DIAGNOSIS — M25561 Pain in right knee: Secondary | ICD-10-CM | POA: Diagnosis not present

## 2016-02-11 ENCOUNTER — Encounter (HOSPITAL_COMMUNITY): Payer: Self-pay

## 2016-02-11 ENCOUNTER — Encounter (HOSPITAL_COMMUNITY)
Admission: RE | Admit: 2016-02-11 | Discharge: 2016-02-11 | Disposition: A | Discharge status: Home / Self Care | Payer: Medicare Other | Source: Ambulatory Visit | Attending: Orthopedic Surgery | Admitting: Orthopedic Surgery

## 2016-02-11 DIAGNOSIS — Z882 Allergy status to sulfonamides status: Secondary | ICD-10-CM | POA: Diagnosis not present

## 2016-02-11 DIAGNOSIS — Z01812 Encounter for preprocedural laboratory examination: Secondary | ICD-10-CM | POA: Diagnosis not present

## 2016-02-11 DIAGNOSIS — M1711 Unilateral primary osteoarthritis, right knee: Secondary | ICD-10-CM | POA: Insufficient documentation

## 2016-02-11 DIAGNOSIS — F419 Anxiety disorder, unspecified: Secondary | ICD-10-CM | POA: Diagnosis not present

## 2016-02-11 DIAGNOSIS — I10 Essential (primary) hypertension: Secondary | ICD-10-CM | POA: Insufficient documentation

## 2016-02-11 DIAGNOSIS — Z881 Allergy status to other antibiotic agents status: Secondary | ICD-10-CM | POA: Insufficient documentation

## 2016-02-11 DIAGNOSIS — Z0183 Encounter for blood typing: Secondary | ICD-10-CM | POA: Insufficient documentation

## 2016-02-11 DIAGNOSIS — E119 Type 2 diabetes mellitus without complications: Secondary | ICD-10-CM | POA: Insufficient documentation

## 2016-02-11 HISTORY — DX: Thyrotoxicosis, unspecified without thyrotoxic crisis or storm: E05.90

## 2016-02-11 HISTORY — DX: Gastro-esophageal reflux disease without esophagitis: K21.9

## 2016-02-11 LAB — COMPREHENSIVE METABOLIC PANEL WITH GFR
ALT: 19 U/L (ref 14–54)
AST: 22 U/L (ref 15–41)
Albumin: 4 g/dL (ref 3.5–5.0)
Alkaline Phosphatase: 75 U/L (ref 38–126)
Anion gap: 9 (ref 5–15)
BUN: 14 mg/dL (ref 6–20)
CO2: 24 mmol/L (ref 22–32)
Calcium: 9.7 mg/dL (ref 8.9–10.3)
Chloride: 106 mmol/L (ref 101–111)
Creatinine, Ser: 0.82 mg/dL (ref 0.44–1.00)
GFR calc Af Amer: >60 mL/min
GFR calc non Af Amer: >60 mL/min
Glucose, Bld: 87 mg/dL (ref 65–99)
Potassium: 3.8 mmol/L (ref 3.5–5.1)
Sodium: 139 mmol/L (ref 135–145)
Total Bilirubin: 0.5 mg/dL (ref 0.3–1.2)
Total Protein: 6.8 g/dL (ref 6.5–8.1)

## 2016-02-11 LAB — CBC WITH DIFFERENTIAL/PLATELET
Basophils Absolute: 0 K/uL (ref 0.0–0.1)
Basophils Relative: 0 %
Eosinophils Absolute: 0.2 K/uL (ref 0.0–0.7)
Eosinophils Relative: 2 %
HCT: 43.4 % (ref 36.0–46.0)
Hemoglobin: 14.7 g/dL (ref 12.0–15.0)
Lymphocytes Relative: 30 %
Lymphs Abs: 3.1 K/uL (ref 0.7–4.0)
MCH: 29.9 pg (ref 26.0–34.0)
MCHC: 33.9 g/dL (ref 30.0–36.0)
MCV: 88.2 fL (ref 78.0–100.0)
Monocytes Absolute: 0.6 K/uL (ref 0.1–1.0)
Monocytes Relative: 5 %
Neutro Abs: 6.5 K/uL (ref 1.7–7.7)
Neutrophils Relative %: 63 %
Platelets: 208 K/uL (ref 150–400)
RBC: 4.92 MIL/uL (ref 3.87–5.11)
RDW: 13.1 % (ref 11.5–15.5)
WBC: 10.3 K/uL (ref 4.0–10.5)

## 2016-02-11 LAB — TYPE AND SCREEN
ABO/RH(D): O POS
Antibody Screen: NEGATIVE

## 2016-02-11 LAB — SURGICAL PCR SCREEN
MRSA, PCR: NEGATIVE
Staphylococcus aureus: NEGATIVE

## 2016-02-11 LAB — ABO/RH: ABO/RH(D): O POS

## 2016-02-11 LAB — GLUCOSE, CAPILLARY: Glucose-Capillary: 122 mg/dL — ABNORMAL HIGH (ref 65–99)

## 2016-02-11 NOTE — Progress Notes (Signed)
Pt. Aware to stop fish oil & meloxicam one week prior to surg., pt. Knows to not take any aspirin.

## 2016-02-11 NOTE — Progress Notes (Signed)
With impending surgery, pt. Felt UTI symptoms, seen by PCP, culture +, started on antibiotic, request for PAT appt. To get another specimen to see if there is yet a response from the antibiotic. Pt. Reports long history of 100-200 UTI's in the past.

## 2016-02-11 NOTE — Pre-Procedure Instructions (Addendum)
Sharon Ochoa  02/11/2016      [facility] - [place], [place] - [address] AT [facility] [address] [place] [address] Phone: [phone] Fax: [phone]    Your procedure is scheduled on 02/23/2016 - MONDAY.  Report to [facility] Admitting at 5:30  A.M.  Call this number if you have problems the morning of surgery:  [phone]   Remember:  Do not eat food or drink liquids after midnight.  On Sunday   Take these medicines the morning of surgery with A SIP OF WATER : PRILOSEC   Do not wear jewelry, make-up or nail polish.   Do not wear lotions, powders, or perfumes.  You may wear deodorant.   Do not shave 48 hours prior to surgery.      Do not bring valuables to the hospital.   [facility] is not responsible for any belongings or valuables.  Contacts, dentures or bridgework may not be worn into surgery.  Leave your suitcase in the car.  After surgery it may be brought to your room.  For patients admitted to the hospital, discharge time will be determined by your treatment team.  Patients discharged the day of surgery will not be allowed to drive home.   Name and phone number of your driver:  With family Special instructions:  Special Instructions: [place] - Preparing for Surgery  Before surgery, you can play an important role.  Because skin is not sterile, your skin needs to be as free of germs as possible.  You can reduce the number of germs on you skin by washing with CHG (chlorahexidine gluconate) soap before surgery.  CHG is an antiseptic cleaner which kills germs and bonds with the skin to continue killing germs even after washing.  Please DO NOT use if you have an allergy to CHG or antibacterial soaps.  If your skin becomes reddened/irritated stop using the CHG and inform your nurse when you arrive at Short Stay.  Do not shave (including legs and underarms) for at least 48 hours prior to the  first CHG shower.  You may shave your face.  Please follow these instructions carefully:   1.  Shower with CHG Soap the night before surgery and the  morning of Surgery.  2.  If you choose to wash your hair, wash your hair first as usual with your  normal shampoo.  3.  After you shampoo, rinse your hair and body thoroughly to remove the  Shampoo.  4.  Use CHG as you would any other liquid soap.  You can apply chg directly to the skin and wash gently with scrungie or a clean washcloth.  5.  Apply the CHG Soap to your body ONLY FROM THE NECK DOWN.    Do not use on open wounds or open sores.  Avoid contact with your eyes, ears, mouth and genitals (private parts).  Wash genitals (private parts)   with your normal soap.  6.  Wash thoroughly, paying special attention to the area where your surgery will be performed.  7.  Thoroughly rinse your body with warm water from the neck down.  8.  DO NOT shower/wash with your normal soap after using and rinsing off   the CHG Soap.  9.  Pat yourself dry with a clean towel.            10 .  Wear clean pajamas.  11.  Place clean sheets on your bed the night of your first shower and do not sleep with pets.  Day of Surgery  Do not apply any lotions/deodorants the morning of surgery.  Please wear clean clothes to the hospital/surgery center.  Please read over the following fact sheets that you were given. Pain Booklet, Coughing and Deep Breathing, Blood Transfusion Information, MRSA Information and Surgical Site Infection Prevention        How to Manage Your Diabetes Before and After Surgery  Why is it important to control my blood sugar before and after surgery? . Improving blood sugar levels before and after surgery helps healing and can limit problems. . A way of improving blood sugar control is eating a healthy diet by: o  Eating less sugar and carbohydrates o  Increasing activity/exercise o  Talking with your doctor about reaching your  blood sugar goals . High blood sugars (greater than 180 mg/dL) can raise your risk of infections and slow your recovery, so you will need to focus on controlling your diabetes during the weeks before surgery. . Make sure that the doctor who takes care of your diabetes knows about your planned surgery including the date and location.  How do I manage my blood sugar before surgery? . Check your blood sugar at least 4 times a day, starting 2 days before surgery, to make sure that the level is not too high or low. o Check your blood sugar the morning of your surgery when you wake up and every 2 hours until you get to the Short Stay unit. . If your blood sugar is less than 70 mg/dL, you will need to treat for low blood sugar: o Do not take insulin. o Treat a low blood sugar (less than 70 mg/dL) with  cup of clear juice (cranberry or apple), 4 glucose tablets, OR glucose gel. o Recheck blood sugar in 15 minutes after treatment (to make sure it is greater than 70 mg/dL). If your blood sugar is not greater than 70 mg/dL on recheck, call [phone] for further instructions. . Report your blood sugar to the short stay nurse when you get to Short Stay.  . If you are admitted to the hospital after surgery: o Your blood sugar will be checked by the staff and you will probably be given insulin after surgery (instead of oral diabetes medicines) to make sure you have good blood sugar levels. o The goal for blood sugar control after surgery is 80-180 mg/dL.              WHAT DO I DO ABOUT MY DIABETES MEDICATION?   Marland Kitchen   . THE NIGHT BEFORE SURGERY, take 8___________ units of _____NOVOLOG 70/30______insulin.       Marland Kitchen HE MORNING OF SURGERY, take ____0_________ units of __________insulin.  . The day of surgery, do not take other diabetes injectables, including Byetta (exenatide), Bydureon (exenatide ER), Victoza (liraglutide), or Trulicity (dulaglutide).  .   Other  Instructions:          Patient Signature:  Date:   Nurse Signature:  Date:   Reviewed and Endorsed by [facility] Patient Education Committee, August 2015

## 2016-02-12 LAB — URINE CULTURE: CULTURE: NO GROWTH

## 2016-02-21 ENCOUNTER — Encounter (HOSPITAL_COMMUNITY): Payer: Self-pay | Admitting: Emergency Medicine

## 2016-02-21 ENCOUNTER — Emergency Department (HOSPITAL_COMMUNITY)
Admission: EM | Admit: 2016-02-21 | Discharge: 2016-02-22 | Disposition: A | Discharge status: Home / Self Care | Payer: Medicare Other | Attending: Emergency Medicine | Admitting: Emergency Medicine

## 2016-02-21 ENCOUNTER — Emergency Department (HOSPITAL_COMMUNITY): Payer: Medicare Other

## 2016-02-21 DIAGNOSIS — R1110 Vomiting, unspecified: Secondary | ICD-10-CM | POA: Diagnosis not present

## 2016-02-21 DIAGNOSIS — K219 Gastro-esophageal reflux disease without esophagitis: Secondary | ICD-10-CM | POA: Insufficient documentation

## 2016-02-21 DIAGNOSIS — Z792 Long term (current) use of antibiotics: Secondary | ICD-10-CM | POA: Insufficient documentation

## 2016-02-21 DIAGNOSIS — M810 Age-related osteoporosis without current pathological fracture: Secondary | ICD-10-CM | POA: Insufficient documentation

## 2016-02-21 DIAGNOSIS — M159 Polyosteoarthritis, unspecified: Secondary | ICD-10-CM | POA: Insufficient documentation

## 2016-02-21 DIAGNOSIS — X58XXXA Exposure to other specified factors, initial encounter: Secondary | ICD-10-CM | POA: Insufficient documentation

## 2016-02-21 DIAGNOSIS — Z794 Long term (current) use of insulin: Secondary | ICD-10-CM | POA: Insufficient documentation

## 2016-02-21 DIAGNOSIS — E119 Type 2 diabetes mellitus without complications: Secondary | ICD-10-CM | POA: Insufficient documentation

## 2016-02-21 DIAGNOSIS — Z79899 Other long term (current) drug therapy: Secondary | ICD-10-CM | POA: Insufficient documentation

## 2016-02-21 DIAGNOSIS — Z8669 Personal history of other diseases of the nervous system and sense organs: Secondary | ICD-10-CM | POA: Insufficient documentation

## 2016-02-21 DIAGNOSIS — Y9289 Other specified places as the place of occurrence of the external cause: Secondary | ICD-10-CM | POA: Insufficient documentation

## 2016-02-21 DIAGNOSIS — Y998 Other external cause status: Secondary | ICD-10-CM | POA: Insufficient documentation

## 2016-02-21 DIAGNOSIS — T17420A Food in trachea causing asphyxiation, initial encounter: Secondary | ICD-10-CM | POA: Insufficient documentation

## 2016-02-21 DIAGNOSIS — T17320A Food in larynx causing asphyxiation, initial encounter: Secondary | ICD-10-CM

## 2016-02-21 DIAGNOSIS — Z8659 Personal history of other mental and behavioral disorders: Secondary | ICD-10-CM | POA: Insufficient documentation

## 2016-02-21 DIAGNOSIS — R0989 Other specified symptoms and signs involving the circulatory and respiratory systems: Secondary | ICD-10-CM | POA: Diagnosis not present

## 2016-02-21 DIAGNOSIS — I10 Essential (primary) hypertension: Secondary | ICD-10-CM | POA: Insufficient documentation

## 2016-02-21 DIAGNOSIS — Y9389 Activity, other specified: Secondary | ICD-10-CM | POA: Insufficient documentation

## 2016-02-21 MED ORDER — ONDANSETRON HCL 4 MG/2ML IJ SOLN
4.0000 mg | Freq: Once | INTRAMUSCULAR | Status: AC
  Administered 2016-02-22: 4 mg via INTRAVENOUS
  Filled 2016-02-21: qty 2

## 2016-02-21 MED ORDER — SODIUM CHLORIDE 0.9 % IV BOLUS (SEPSIS)
500.0000 mL | Freq: Once | INTRAVENOUS | Status: AC
Start: 2016-02-21 — End: 2016-02-22
  Administered 2016-02-22: 500 mL via INTRAVENOUS

## 2016-02-21 NOTE — ED Notes (Signed)
Patient states she got choked on something around 7. States she initially had some trouble breathing but was able to cough up what had choked her. Family reports there was redness to what she had coughed up, states they were eating [place], patient states she thinks it was just [facility]. Patient states since that time she has had an increased gag reflex, no difficulty breathing at this time. Patient states she has not had to have an endoscopy in the past.

## 2016-02-21 NOTE — ED Provider Notes (Signed)
CSN: [id number]     Arrival date & time 02/21/16  2145 History  By signing my name below, I, Rowan Blase, attest that this documentation has been prepared under the direction and in the presence of Orpah Greek, MD . Electronically Signed: Rowan Blase, Scribe. 02/21/2016. 11:43 PM.   Chief Complaint  Patient presents with  . increased gag reflex    The history is provided by the patient and a relative. No language interpreter was used.   HPI Comments:  Sharon Ochoa is a 70 y.o. female who presents to the Emergency Department complaining of choking on food ~4 hours ago. Pt reports she experienced difficulty breathing but was able to dislodge the food. She reports associated throat pain, increased mucous in her throat and worsening gag reflex. Pt later drank water and juice, and ate a small amount of yogurt and cheese. Pt reports she then had one episode of vomiting. Pt states she still feels like something is stuck in her throat. She administered a dose of insulin with her meal and notes glucose level of 276 PTA. Pt is scheduled for knee surgery next week.  Past Medical History  Diagnosis Date  . Allergy   . Cataract   . Diabetes mellitus without complication ([place])   . Hypertension   . Osteoporosis   . Hyperthyroidism 2015    treated /w radioactive iodine   . Anxiety     pt. doesn't take any medicine, pt. reports that he uses puzzles,nature to cope   . GERD (gastroesophageal reflux disease)   . Arthritis     both wrists, knees   . Primary localized osteoarthritis of right knee    Past Surgical History  Procedure Laterality Date  . Abdominal hysterectomy    . Eye surgery      bilateral cataracts, with IOL  . Tonsillectomy    . Knee surgery Left 1996    meniscus repair  . Knee surgery Right 1/98    scope done    Family History  Problem Relation Age of Onset  . Osteoporosis Mother   . Heart disease Father   . Osteoporosis Sister   . Ulcers Brother   .  Asthma Brother   . Diabetes Maternal Grandmother    Social History  Substance Use Topics  . Smoking status: Never Smoker   . Smokeless tobacco: Never Used  . Alcohol Use: No     Comment: rare use    OB History    No data available     Review of Systems  HENT: Positive for sore throat.   Gastrointestinal: Positive for vomiting.  All other systems reviewed and are negative.  Allergies  Sulfa antibiotics; Bactrim; Erythromycin; Macrodantin; and Nitrofurantoin  Home Medications   Prior to Admission medications   Medication Sig Start Date End Date Taking? Authorizing Provider  acetaminophen (TYLENOL) 325 MG tablet Take 650 mg by mouth every 6 (six) hours as needed (for pain.).   Yes Historical Provider, MD  cephALEXin (KEFLEX) 500 MG capsule Take 500 mg by mouth 4 (four) times daily.   Yes Historical Provider, MD  cholecalciferol (VITAMIN D) 1000 units tablet Take 2,000 Units by mouth daily.   Yes Historical Provider, MD  CRANBERRY CONCENTRATE PO Take 2,500 mg by mouth daily.   Yes Historical Provider, MD  docusate sodium (COLACE) 100 MG capsule Take 100 mg by mouth daily as needed for mild constipation.   Yes Historical Provider, MD  glucose blood test strip 1 each by  Other route 3 (three) times daily.  06/09/15  Yes Historical Provider, MD  insulin aspart protamine - aspart (NOVOLOG MIX 70/30 FLEXPEN) (70-30) 100 UNIT/ML FlexPen Inject 12-22 Units into the skin 2 (two) times daily with a meal. 22 units in the morning, 12 units in the evening 08/18/15  Yes Historical Provider, MD  lisinopril (PRINIVIL,ZESTRIL) 20 MG tablet Take 20 mg by mouth daily.   Yes Historical Provider, MD  meloxicam (MOBIC) 15 MG tablet Take 15 mg by mouth.   Yes Historical Provider, MD  Omega-3 Fatty Acids (FISH OIL) 1000 MG CPDR Take 1,000 mg by mouth daily as needed (constipation).   Yes Historical Provider, MD  omeprazole (PRILOSEC) 20 MG capsule Take 20 mg by mouth daily.  03/18/13  Yes Historical Provider,  MD  saccharomyces boulardii (FLORASTOR) 250 MG capsule Take 250 mg by mouth 2 (two) times daily as needed (when taking antibiotics).   Yes Historical Provider, MD  vitamin C (ASCORBIC ACID) 500 MG tablet Take 500 mg by mouth daily as needed (for cold symptoms.).   Yes Historical Provider, MD   BP 170/78 mmHg  Pulse 84  Temp(Src) 99 F (37.2 C) (Oral)  Resp 18  Ht 5\' 5"  (1.651 m)  Wt 207 lb (93.895 kg)  BMI 34.45 kg/m2  SpO2 97% Physical Exam  Constitutional: She is oriented to person, place, and time. She appears well-developed and well-nourished. No distress.  HENT:  Head: Normocephalic and atraumatic.  Right Ear: Hearing normal.  Left Ear: Hearing normal.  Nose: Nose normal.  Mouth/Throat: Oropharynx is clear and moist and mucous membranes are normal.  Eyes: Conjunctivae and EOM are normal. Pupils are equal, round, and reactive to light.  Neck: Normal range of motion. Neck supple.  Cardiovascular: Regular rhythm, S1 normal and S2 normal.  Exam reveals no gallop and no friction rub.   No murmur heard. Pulmonary/Chest: Effort normal and breath sounds normal. No respiratory distress. She exhibits no tenderness.  Abdominal: Soft. Normal appearance and bowel sounds are normal. There is no hepatosplenomegaly. There is no tenderness. There is no rebound, no guarding, no tenderness at McBurney's point and negative Murphy's sign. No hernia.  Musculoskeletal: Normal range of motion.  Neurological: She is alert and oriented to person, place, and time. She has normal strength. No cranial nerve deficit or sensory deficit. Coordination normal. GCS eye subscore is 4. GCS verbal subscore is 5. GCS motor subscore is 6.  Skin: Skin is warm, dry and intact. No rash noted. No cyanosis.  Psychiatric: She has a normal mood and affect. Her speech is normal and behavior is normal. Thought content normal.  Nursing note and vitals reviewed.   ED Course  Procedures  DIAGNOSTIC STUDIES:  Oxygen  Saturation is 95% on RA, adequate by my interpretation.    COORDINATION OF CARE:  11:37 PM Will administer Zofran and order imaging of chest and neck. Will order CBC and BMP. Discussed treatment plan with pt at bedside and pt agreed to plan.  Labs Review Labs Reviewed  CBC WITH DIFFERENTIAL/PLATELET - Abnormal; Notable for the following:    WBC 13.5 (*)    Neutro Abs 10.8 (*)    All other components within normal limits  BASIC METABOLIC PANEL - Abnormal; Notable for the following:    Glucose, Bld 233 (*)    All other components within normal limits    Imaging Review Dg Neck Soft Tissue  02/21/2016  CLINICAL DATA:  Choked on food tonight and feels like there is  something stuck in throat. EXAM: NECK SOFT TISSUES - 1+ VIEW COMPARISON:  None. FINDINGS: There is no evidence of retropharyngeal soft tissue swelling or epiglottic enlargement. The cervical airway is unremarkable and no radio-opaque foreign body identified. IMPRESSION: Negative. Electronically Signed   By: Monte Fantasia M.D.   On: 02/21/2016 23:55   Dg Chest 2 View  02/21/2016  CLINICAL DATA:  Choked on food. Sensation of object stuck in throat. Vomiting. Initial encounter. EXAM: CHEST  2 VIEW COMPARISON:  Chest radiograph performed 06/24/2015 FINDINGS: The lungs are well-aerated and clear. There is no evidence of focal opacification, pleural effusion or pneumothorax. The heart is normal in size; the mediastinal contour is within normal limits. No acute osseous abnormalities are seen. No radiopaque foreign bodies are identified. IMPRESSION: No acute cardiopulmonary process seen. Electronically Signed   By: Garald Balding M.D.   On: 02/21/2016 23:58   I have personally reviewed and evaluated these images and lab results as part of my medical decision-making.   EKG Interpretation None      MDM   Final diagnoses:  None  Choking episode  Patient reports that she choked on food while eating dinner. In the several hours after  the episode she started to feel like she was having increased secretions in her throat and felt like she was gagging. She did vomit. She has been able to eat yogurt and drink liquids since choking. She is not having any difficulty breathing. Visual parts of the oropharyngeal region is normal without trauma. Soft tissue x-ray of neck was normal. Chest x-ray was clear, no evidence of aspiration or lung collapse. Patient appears comfortable. She was reassured, no other intervention necessary. She likely has some small amount of swelling from the choking episode that will improve.   I personally performed the services described in this documentation, which was scribed in my presence. The recorded information has been reviewed and is accurate.    Orpah Greek, MD 02/22/16 [phone]

## 2016-02-22 LAB — CBC WITH DIFFERENTIAL/PLATELET
Basophils Absolute: 0 10*3/uL (ref 0.0–0.1)
Basophils Relative: 0 %
Eosinophils Absolute: 0.1 10*3/uL (ref 0.0–0.7)
Eosinophils Relative: 1 %
HEMATOCRIT: 40.6 % (ref 36.0–46.0)
Hemoglobin: 14.4 g/dL (ref 12.0–15.0)
LYMPHS ABS: 1.8 10*3/uL (ref 0.7–4.0)
LYMPHS PCT: 13 %
MCH: 30.3 pg (ref 26.0–34.0)
MCHC: 35.5 g/dL (ref 30.0–36.0)
MCV: 85.3 fL (ref 78.0–100.0)
MONO ABS: 0.8 10*3/uL (ref 0.1–1.0)
MONOS PCT: 6 %
NEUTROS ABS: 10.8 10*3/uL — AB (ref 1.7–7.7)
Neutrophils Relative %: 80 %
PLATELETS: 225 10*3/uL (ref 150–400)
RBC: 4.76 MIL/uL (ref 3.87–5.11)
RDW: 13.1 % (ref 11.5–15.5)
WBC: 13.5 10*3/uL — ABNORMAL HIGH (ref 4.0–10.5)

## 2016-02-22 LAB — BASIC METABOLIC PANEL
Anion gap: 9 (ref 5–15)
BUN: 19 mg/dL (ref 6–20)
CHLORIDE: 104 mmol/L (ref 101–111)
CO2: 25 mmol/L (ref 22–32)
Calcium: 9.9 mg/dL (ref 8.9–10.3)
Creatinine, Ser: 0.79 mg/dL (ref 0.44–1.00)
Glucose, Bld: 233 mg/dL — ABNORMAL HIGH (ref 65–99)
POTASSIUM: 3.9 mmol/L (ref 3.5–5.1)
SODIUM: 138 mmol/L (ref 135–145)

## 2016-02-22 MED ORDER — VANCOMYCIN HCL 10 G IV SOLR
1500.0000 mg | INTRAVENOUS | Status: AC
  Administered 2016-02-23: 1500 mg via INTRAVENOUS
  Filled 2016-02-22: qty 1500

## 2016-02-22 MED ORDER — ONDANSETRON HCL 4 MG PO TABS: 4.0000 mg | ORAL_TABLET | Freq: Four times a day (QID) | ORAL | Status: DC

## 2016-02-22 MED ORDER — LACTATED RINGERS IV SOLN
INTRAVENOUS | Status: DC
  Administered 2016-02-23: 10:00:00 via INTRAVENOUS
  Administered 2016-02-23: 20 mL/h via INTRAVENOUS

## 2016-02-22 NOTE — Discharge Instructions (Signed)
Choking, Adult °Choking occurs when a food or object gets stuck in the throat or trachea, blocking the airway. If the airway is partly blocked, coughing will usually cause the food or object to come out. If the airway is completely blocked, immediate action is needed to help it come out. A complete airway blockage is life threatening because it causes breathing to stop. Choking is a true medical emergency that requires fast, appropriate action by anyone available. °SIGNS OF AIRWAY BLOCKAGE °There is a partial airway blockage if you or the person who is choking is:  °· Able to breathe and speak. °· Coughing loudly. °· Making loud noises. °There is a complete airway blockage if you or the person who is choking is:  °· Unable to breathe. °· Making soft or high-pitched sounds while breathing. °· Unable to cough or coughing weakly, ineffectively, or silently. °· Unable to cry, speak, or make sounds. °· Turning blue. °· Holding the neck with both arms. This is the universal sign of choking. °WHAT TO DO IF CHOKING OCCURS °If there is a partial airway blockage, allow coughing to clear the airway. Do not try to drink until the food or object comes out. If someone else has a partial airway blockage, do not interfere. Stay with him or her and watch for signs of complete airway blockage until the food or object comes out.  °If there is a complete airway blockage or if there is a partial airway blockage and the food or object does not come out, perform abdominal thrusts (also referred to as the Heimlich maneuver). Abdominal thrusts are used to create an artificial cough to try to clear the airway. Performing abdominal thrusts is part of a series of steps that should be done to help someone who is choking. Abdominal thrusts are usually done by someone else, but if you are alone, you can perform abdominal thrusts on yourself. Follow the procedure below that best fits your situation.  °IF SOMEONE ELSE IS CHOKING: °For a conscious  adult:  °1. Ask the person whether he or she is choking. If the person nods, continue to step 2. °2. Stand or kneel behind the person and lean him or her forward slightly. °3. Make a fist with 1 hand, put your arms around the person, and grasp your fist with your other hand. Place the thumb side of your fist in the person's abdomen, just below the ribs. °4. Press inward and upward with both hands. °5. Repeat this maneuver until the object comes out and the person is able to breathe or until the person loses consciousness. °For an unconscious adult: °1. Shout for help. If someone responds, have him or her call local emergency services (911 in [place] If no one responds, call local emergency services yourself if possible. °2. Begin CPR, starting with compressions. Every time you open the airway to give rescue breaths, open the person's mouth. If you can see the food or object and it can be easily pulled out, remove it with your fingers. °3. After 5 cycles or 2 minutes of CPR, call local emergency services (911 in [place] if you or someone else did not already call. °For a conscious adult who is obese or in the later stages of pregnancy: °Abdominal thrusts may not be effective when helping people who are in the later stages of pregnancy or who are obese. In these instances, chest thrusts can be used.  °1. Ask the person whether he or she is choking. If the person   nods and has signs of complete airway blockage, continue to step 2. °2. Stand behind the person and wrap your arms around his or her chest (with your arms under the person's armpits). °3. Make a fist with 1 hand. Place the thumb side of your fist on the middle of the person's breastbone. °4. Grab your fist with your other hand and thrust backward. Continue this until the object comes out or until the person becomes unconscious. °For an unconscious adult who is obese or in the later stages of pregnancy:  °1. Shout for help. If someone responds, have him or her  call local emergency services (911 in [place] If no one responds, call local emergency services yourself if possible. °2. Begin CPR, starting with compressions. Every time you open the airway to give rescue breaths, open the person's mouth. If you can see the food or object and it can be easily pulled out, remove it with your fingers. °3. After 5 cycles or 2 minutes of CPR, call local emergency services (911 in [place] if you or someone else did not already call. °Note that abdominal thrusts (below the rib cage) should be used for a pregnant woman if possible. This should be possible until the later stages of pregnancy when there is no longer enough room between the enlarging uterus and the rib cage to perform the maneuver. At that point, chest thrusts must be used as described. °IF YOU ARE CHOKING: °1. Call local emergency services (911 in [place] if near a landline. Do not worry about communicating what is happening. Do not hang up the phone. Someone may be sent to help you anyway. °2. Make a fist with 1 hand. Put the thumb side of the fist against your stomach, just above the belly button and well below the breastbone. If you are pregnant or obese, put your fist on your chest instead, just below the breastbone and just above your lowest ribs. °3. Hold your fist with your other hand and bend over a hard surface, such as a table or chair. °4. Forcefully push your fist in and up. °5. Continue to do this until the food or object comes out. °PREVENTION  °To be prepared if choking occurs, learn how to correctly perform abdominal thrusts and give CPR by taking a certified first-aid training course.  °SEEK IMMEDIATE MEDICAL CARE IF: °· You have a fever after choking stops. °· You have problems breathing after choking stops. °· You received the Heimlich maneuver. °MAKE SURE YOU: °· Understand these instructions. °· Watch your condition. °· Get help right away if you are not doing well or get worse. °  °This information is not  intended to replace advice given to you by your health care provider. Make sure you discuss any questions you have with your health care provider. °  °Document Released: 11/18/2004 Document Revised: 11/01/2014 Document Reviewed: 05/23/2012 °Elsevier Interactive Patient Education ©2016 [employer] ° °

## 2016-02-23 ENCOUNTER — Inpatient Hospital Stay (HOSPITAL_COMMUNITY)
Admission: RE | Admit: 2016-02-23 | Discharge: 2016-02-25 | DRG: 470 | Disposition: A | Discharge status: Home Health | Payer: Medicare Other | Source: Ambulatory Visit | Attending: Orthopedic Surgery | Admitting: Orthopedic Surgery

## 2016-02-23 ENCOUNTER — Encounter (HOSPITAL_COMMUNITY): Payer: Self-pay | Admitting: Certified Registered Nurse Anesthetist

## 2016-02-23 ENCOUNTER — Encounter (HOSPITAL_COMMUNITY)
Admission: RE | Disposition: A | Discharge status: Home Health | Payer: Self-pay | Source: Ambulatory Visit | Attending: Orthopedic Surgery

## 2016-02-23 ENCOUNTER — Inpatient Hospital Stay (HOSPITAL_COMMUNITY): Payer: Medicare Other | Admitting: Anesthesiology

## 2016-02-23 DIAGNOSIS — K219 Gastro-esophageal reflux disease without esophagitis: Secondary | ICD-10-CM | POA: Diagnosis present

## 2016-02-23 DIAGNOSIS — G8918 Other acute postprocedural pain: Secondary | ICD-10-CM | POA: Diagnosis not present

## 2016-02-23 DIAGNOSIS — M25561 Pain in right knee: Secondary | ICD-10-CM | POA: Diagnosis not present

## 2016-02-23 DIAGNOSIS — B9561 Methicillin susceptible Staphylococcus aureus infection as the cause of diseases classified elsewhere: Secondary | ICD-10-CM | POA: Diagnosis present

## 2016-02-23 DIAGNOSIS — M1711 Unilateral primary osteoarthritis, right knee: Secondary | ICD-10-CM | POA: Diagnosis present

## 2016-02-23 DIAGNOSIS — I10 Essential (primary) hypertension: Secondary | ICD-10-CM | POA: Diagnosis present

## 2016-02-23 DIAGNOSIS — E119 Type 2 diabetes mellitus without complications: Secondary | ICD-10-CM | POA: Diagnosis present

## 2016-02-23 DIAGNOSIS — H11829 Conjunctivochalasis, unspecified eye: Secondary | ICD-10-CM | POA: Diagnosis present

## 2016-02-23 DIAGNOSIS — Z6834 Body mass index (BMI) 34.0-34.9, adult: Secondary | ICD-10-CM

## 2016-02-23 DIAGNOSIS — F419 Anxiety disorder, unspecified: Secondary | ICD-10-CM | POA: Diagnosis present

## 2016-02-23 DIAGNOSIS — N390 Urinary tract infection, site not specified: Secondary | ICD-10-CM | POA: Diagnosis not present

## 2016-02-23 DIAGNOSIS — T7840XA Allergy, unspecified, initial encounter: Secondary | ICD-10-CM | POA: Diagnosis present

## 2016-02-23 DIAGNOSIS — M1990 Unspecified osteoarthritis, unspecified site: Secondary | ICD-10-CM | POA: Diagnosis present

## 2016-02-23 DIAGNOSIS — M179 Osteoarthritis of knee, unspecified: Secondary | ICD-10-CM | POA: Diagnosis not present

## 2016-02-23 HISTORY — DX: Unilateral primary osteoarthritis, right knee: M17.11

## 2016-02-23 HISTORY — DX: Anxiety disorder, unspecified: F41.9

## 2016-02-23 HISTORY — DX: Urinary tract infection, site not specified: N39.0

## 2016-02-23 HISTORY — PX: TOTAL KNEE ARTHROPLASTY: SHX125

## 2016-02-23 LAB — GLUCOSE, CAPILLARY
Glucose-Capillary: 174 mg/dL — ABNORMAL HIGH (ref 65–99)
Glucose-Capillary: 143 mg/dL — ABNORMAL HIGH (ref 65–99)
Glucose-Capillary: 231 mg/dL — ABNORMAL HIGH (ref 65–99)

## 2016-02-23 SURGERY — ARTHROPLASTY, KNEE, TOTAL
Anesthesia: Spinal | Laterality: Right

## 2016-02-23 MED ORDER — VITAMIN D 1000 UNITS PO TABS
2000.0000 [IU] | ORAL_TABLET | Freq: Every day | ORAL | Status: DC
  Administered 2016-02-24 – 2016-02-25 (×2): 2000 [IU] via ORAL
  Filled 2016-02-23 (×2): qty 2

## 2016-02-23 MED ORDER — FENTANYL CITRATE (PF) 100 MCG/2ML IJ SOLN
INTRAMUSCULAR | Status: AC
  Filled 2016-02-23: qty 2

## 2016-02-23 MED ORDER — MIDAZOLAM HCL 2 MG/2ML IJ SOLN
2.0000 mg | Freq: Once | INTRAMUSCULAR | Status: AC
  Administered 2016-02-23: 2 mg via INTRAVENOUS

## 2016-02-23 MED ORDER — ONDANSETRON HCL 4 MG/2ML IJ SOLN
4.0000 mg | Freq: Four times a day (QID) | INTRAMUSCULAR | Status: DC | PRN
  Administered 2016-02-23 – 2016-02-24 (×2): 4 mg via INTRAVENOUS
  Filled 2016-02-23 (×2): qty 2

## 2016-02-23 MED ORDER — LIDOCAINE HCL (CARDIAC) 20 MG/ML IV SOLN
INTRAVENOUS | Status: DC | PRN
  Administered 2016-02-23: 50 mg via INTRATRACHEAL

## 2016-02-23 MED ORDER — ONDANSETRON HCL 4 MG/2ML IJ SOLN
INTRAMUSCULAR | Status: AC
  Filled 2016-02-23: qty 2

## 2016-02-23 MED ORDER — DIPHENHYDRAMINE HCL 12.5 MG/5ML PO ELIX: 12.5000 mg | ORAL_SOLUTION | ORAL | Status: DC | PRN

## 2016-02-23 MED ORDER — POVIDONE-IODINE 7.5 % EX SOLN
Freq: Once | CUTANEOUS | Status: DC
  Filled 2016-02-23: qty 118

## 2016-02-23 MED ORDER — ONDANSETRON HCL 4 MG/2ML IJ SOLN: 4.0000 mg | Freq: Once | INTRAMUSCULAR | Status: DC | PRN

## 2016-02-23 MED ORDER — MIDAZOLAM HCL 5 MG/5ML IJ SOLN
INTRAMUSCULAR | Status: DC | PRN
  Administered 2016-02-23: 2 mg via INTRAVENOUS

## 2016-02-23 MED ORDER — VANCOMYCIN HCL 10 G IV SOLR
1500.0000 mg | Freq: Once | INTRAVENOUS | Status: AC
  Administered 2016-02-23: 1500 mg via INTRAVENOUS
  Filled 2016-02-23: qty 1500

## 2016-02-23 MED ORDER — METOCLOPRAMIDE HCL 5 MG PO TABS: 5.0000 mg | ORAL_TABLET | Freq: Three times a day (TID) | ORAL | Status: DC | PRN

## 2016-02-23 MED ORDER — PHENYLEPHRINE HCL 10 MG/ML IJ SOLN
INTRAMUSCULAR | Status: DC | PRN
  Administered 2016-02-23: 80 ug via INTRAVENOUS
  Administered 2016-02-23: 40 ug via INTRAVENOUS
  Administered 2016-02-23 (×2): 80 ug via INTRAVENOUS

## 2016-02-23 MED ORDER — ACETAMINOPHEN 325 MG PO TABS
650.0000 mg | ORAL_TABLET | Freq: Four times a day (QID) | ORAL | Status: DC | PRN
  Administered 2016-02-24: 650 mg via ORAL
  Filled 2016-02-23: qty 2

## 2016-02-23 MED ORDER — BUPIVACAINE-EPINEPHRINE (PF) 0.5% -1:200000 IJ SOLN
INTRAMUSCULAR | Status: DC | PRN
  Administered 2016-02-23: 30 mL

## 2016-02-23 MED ORDER — PHENYLEPHRINE HCL 10 MG/ML IJ SOLN
10.0000 mg | INTRAVENOUS | Status: DC | PRN
  Administered 2016-02-23: 20 ug/min via INTRAVENOUS

## 2016-02-23 MED ORDER — MENTHOL 3 MG MT LOZG: 1.0000 | LOZENGE | OROMUCOSAL | Status: DC | PRN

## 2016-02-23 MED ORDER — APIXABAN 2.5 MG PO TABS
2.5000 mg | ORAL_TABLET | Freq: Two times a day (BID) | ORAL | Status: DC
  Administered 2016-02-24 – 2016-02-25 (×3): 2.5 mg via ORAL
  Filled 2016-02-23 (×3): qty 1

## 2016-02-23 MED ORDER — INSULIN ASPART 100 UNIT/ML ~~LOC~~ SOLN
0.0000 [IU] | Freq: Every day | SUBCUTANEOUS | Status: DC
  Administered 2016-02-23: 3 [IU] via SUBCUTANEOUS
  Administered 2016-02-24: 2 [IU] via SUBCUTANEOUS

## 2016-02-23 MED ORDER — PROPOFOL 10 MG/ML IV BOLUS
INTRAVENOUS | Status: AC
  Filled 2016-02-23: qty 20

## 2016-02-23 MED ORDER — ONDANSETRON HCL 4 MG PO TABS: 4.0000 mg | ORAL_TABLET | Freq: Four times a day (QID) | ORAL | Status: DC | PRN

## 2016-02-23 MED ORDER — INSULIN ASPART 100 UNIT/ML ~~LOC~~ SOLN
0.0000 [IU] | Freq: Three times a day (TID) | SUBCUTANEOUS | Status: DC
  Administered 2016-02-23: 7 [IU] via SUBCUTANEOUS
  Administered 2016-02-24: 11 [IU] via SUBCUTANEOUS
  Administered 2016-02-24 – 2016-02-25 (×3): 7 [IU] via SUBCUTANEOUS
  Administered 2016-02-25: 11 [IU] via SUBCUTANEOUS

## 2016-02-23 MED ORDER — MIDAZOLAM HCL 2 MG/2ML IJ SOLN
INTRAMUSCULAR | Status: AC
  Filled 2016-02-23: qty 2

## 2016-02-23 MED ORDER — PROPOFOL 1000 MG/100ML IV EMUL
INTRAVENOUS | Status: AC
  Filled 2016-02-23: qty 200

## 2016-02-23 MED ORDER — INSULIN ASPART 100 UNIT/ML ~~LOC~~ SOLN
4.0000 [IU] | Freq: Three times a day (TID) | SUBCUTANEOUS | Status: DC
  Administered 2016-02-23 – 2016-02-25 (×6): 4 [IU] via SUBCUTANEOUS

## 2016-02-23 MED ORDER — PHENOL 1.4 % MT LIQD: 1.0000 | OROMUCOSAL | Status: DC | PRN

## 2016-02-23 MED ORDER — TRANEXAMIC ACID 1000 MG/10ML IV SOLN: 1000.0000 mg | Freq: Once | INTRAVENOUS | Status: DC

## 2016-02-23 MED ORDER — FENTANYL CITRATE (PF) 100 MCG/2ML IJ SOLN
100.0000 ug | Freq: Once | INTRAMUSCULAR | Status: AC
  Administered 2016-02-23: 50 ug via INTRAVENOUS

## 2016-02-23 MED ORDER — OXYCODONE HCL 5 MG PO TABS
ORAL_TABLET | ORAL | Status: AC
  Filled 2016-02-23: qty 1

## 2016-02-23 MED ORDER — METOCLOPRAMIDE HCL 5 MG/ML IJ SOLN
5.0000 mg | Freq: Three times a day (TID) | INTRAMUSCULAR | Status: DC | PRN
  Administered 2016-02-24: 10 mg via INTRAVENOUS
  Filled 2016-02-23: qty 2

## 2016-02-23 MED ORDER — BUPIVACAINE IN DEXTROSE 0.75-8.25 % IT SOLN
INTRATHECAL | Status: DC | PRN
  Administered 2016-02-23: 2 mL via INTRATHECAL

## 2016-02-23 MED ORDER — PROPOFOL 10 MG/ML IV BOLUS
INTRAVENOUS | Status: DC | PRN
  Administered 2016-02-23: 20 mg via INTRAVENOUS

## 2016-02-23 MED ORDER — LISINOPRIL 20 MG PO TABS
20.0000 mg | ORAL_TABLET | Freq: Every day | ORAL | Status: DC
Start: 2016-02-25 — End: 2016-02-25
  Administered 2016-02-25: 20 mg via ORAL
  Filled 2016-02-23: qty 1

## 2016-02-23 MED ORDER — FENTANYL CITRATE (PF) 100 MCG/2ML IJ SOLN
25.0000 ug | INTRAMUSCULAR | Status: DC | PRN
  Administered 2016-02-23: 25 ug via INTRAVENOUS
  Administered 2016-02-23: 50 ug via INTRAVENOUS
  Administered 2016-02-23: 25 ug via INTRAVENOUS

## 2016-02-23 MED ORDER — SACCHAROMYCES BOULARDII 250 MG PO CAPS: 250.0000 mg | ORAL_CAPSULE | Freq: Two times a day (BID) | ORAL | Status: DC | PRN

## 2016-02-23 MED ORDER — CHLORHEXIDINE GLUCONATE 4 % EX LIQD: 60.0000 mL | Freq: Once | CUTANEOUS | Status: DC

## 2016-02-23 MED ORDER — ALUM & MAG HYDROXIDE-SIMETH 200-200-20 MG/5ML PO SUSP: 30.0000 mL | ORAL | Status: DC | PRN

## 2016-02-23 MED ORDER — VITAMIN C 500 MG PO TABS: 500.0000 mg | ORAL_TABLET | Freq: Every day | ORAL | Status: DC | PRN

## 2016-02-23 MED ORDER — ONDANSETRON HCL 4 MG/2ML IJ SOLN
INTRAMUSCULAR | Status: DC | PRN
  Administered 2016-02-23: 4 mg via INTRAVENOUS

## 2016-02-23 MED ORDER — DOCUSATE SODIUM 100 MG PO CAPS
100.0000 mg | ORAL_CAPSULE | Freq: Two times a day (BID) | ORAL | Status: DC
  Administered 2016-02-23 – 2016-02-25 (×4): 100 mg via ORAL
  Filled 2016-02-23 (×4): qty 1

## 2016-02-23 MED ORDER — HYDROMORPHONE HCL 1 MG/ML IJ SOLN
0.5000 mg | INTRAMUSCULAR | Status: DC | PRN
  Administered 2016-02-24: 0.5 mg via INTRAVENOUS
  Administered 2016-02-24: 1 mg via INTRAVENOUS
  Filled 2016-02-23 (×2): qty 1

## 2016-02-23 MED ORDER — BUPIVACAINE-EPINEPHRINE (PF) 0.25% -1:200000 IJ SOLN
INTRAMUSCULAR | Status: AC
  Filled 2016-02-23: qty 30

## 2016-02-23 MED ORDER — CEFAZOLIN SODIUM-DEXTROSE 2-4 GM/100ML-% IV SOLN
2.0000 g | Freq: Four times a day (QID) | INTRAVENOUS | Status: AC
  Filled 2016-02-23 (×2): qty 100

## 2016-02-23 MED ORDER — SODIUM CHLORIDE 0.9 % IR SOLN
Status: DC | PRN
  Administered 2016-02-23: 3000 mL

## 2016-02-23 MED ORDER — POLYETHYLENE GLYCOL 3350 17 G PO PACK
17.0000 g | PACK | Freq: Two times a day (BID) | ORAL | Status: DC
  Administered 2016-02-24 – 2016-02-25 (×3): 17 g via ORAL
  Filled 2016-02-23 (×3): qty 1

## 2016-02-23 MED ORDER — FENTANYL CITRATE (PF) 250 MCG/5ML IJ SOLN
INTRAMUSCULAR | Status: AC
  Filled 2016-02-23: qty 5

## 2016-02-23 MED ORDER — ROCURONIUM BROMIDE 50 MG/5ML IV SOLN
INTRAVENOUS | Status: AC
  Filled 2016-02-23: qty 1

## 2016-02-23 MED ORDER — FENTANYL CITRATE (PF) 100 MCG/2ML IJ SOLN
INTRAMUSCULAR | Status: DC | PRN
  Administered 2016-02-23: 25 ug via INTRAVENOUS

## 2016-02-23 MED ORDER — PROPOFOL 500 MG/50ML IV EMUL
INTRAVENOUS | Status: DC | PRN
  Administered 2016-02-23: 30 ug/kg/min via INTRAVENOUS

## 2016-02-23 MED ORDER — POTASSIUM CHLORIDE IN NACL 20-0.9 MEQ/L-% IV SOLN
INTRAVENOUS | Status: DC
  Administered 2016-02-23 – 2016-02-24 (×2): via INTRAVENOUS
  Filled 2016-02-23 (×2): qty 1000

## 2016-02-23 MED ORDER — PANTOPRAZOLE SODIUM 40 MG PO TBEC
40.0000 mg | DELAYED_RELEASE_TABLET | Freq: Every day | ORAL | Status: DC
Start: 2016-02-24 — End: 2016-02-25
  Administered 2016-02-24 – 2016-02-25 (×2): 40 mg via ORAL
  Filled 2016-02-23 (×2): qty 1

## 2016-02-23 MED ORDER — ACETAMINOPHEN 650 MG RE SUPP: 650.0000 mg | Freq: Four times a day (QID) | RECTAL | Status: DC | PRN

## 2016-02-23 MED ORDER — PHENYLEPHRINE 40 MCG/ML (10ML) SYRINGE FOR IV PUSH (FOR BLOOD PRESSURE SUPPORT)
PREFILLED_SYRINGE | INTRAVENOUS | Status: AC
  Filled 2016-02-23: qty 10

## 2016-02-23 MED ORDER — DEXAMETHASONE SODIUM PHOSPHATE 10 MG/ML IJ SOLN
10.0000 mg | Freq: Three times a day (TID) | INTRAMUSCULAR | Status: AC
  Administered 2016-02-23 – 2016-02-24 (×4): 10 mg via INTRAVENOUS
  Filled 2016-02-23 (×4): qty 1

## 2016-02-23 MED ORDER — DEXAMETHASONE SODIUM PHOSPHATE 10 MG/ML IJ SOLN
INTRAMUSCULAR | Status: DC | PRN
  Administered 2016-02-23: 10 mg via INTRAVENOUS

## 2016-02-23 MED ORDER — OXYCODONE HCL 5 MG PO TABS
5.0000 mg | ORAL_TABLET | ORAL | Status: DC | PRN
  Administered 2016-02-23: 10 mg via ORAL
  Administered 2016-02-23 (×2): 5 mg via ORAL
  Administered 2016-02-23 – 2016-02-25 (×9): 10 mg via ORAL
  Filled 2016-02-23 (×10): qty 2

## 2016-02-23 MED ORDER — INSULIN GLARGINE 100 UNIT/ML ~~LOC~~ SOLN
15.0000 [IU] | Freq: Every day | SUBCUTANEOUS | Status: DC
  Administered 2016-02-23: 15 [IU] via SUBCUTANEOUS
  Filled 2016-02-23 (×2): qty 0.15

## 2016-02-23 MED ORDER — LACTATED RINGERS IV SOLN: INTRAVENOUS | Status: DC

## 2016-02-23 SURGICAL SUPPLY — 70 items
BANDAGE ESMARK 6X9 LF (GAUZE/BANDAGES/DRESSINGS) ×1 IMPLANT
BENZOIN TINCTURE PRP APPL 2/3 (GAUZE/BANDAGES/DRESSINGS) ×3 IMPLANT
BLADE SAGITTAL 25.0X1.19X90 (BLADE) ×2 IMPLANT
BLADE SAGITTAL 25.0X1.19X90MM (BLADE) ×1
BLADE SAW RECIP 87.9 MT (BLADE) ×3 IMPLANT
BLADE SAW SAG 90X13X1.27 (BLADE) ×3 IMPLANT
BLADE SAW SGTL 13X75X1.27 (BLADE) IMPLANT
BLADE SURG 10 STRL SS (BLADE) ×6 IMPLANT
BNDG ELASTIC 6X15 VLCR STRL LF (GAUZE/BANDAGES/DRESSINGS) ×3 IMPLANT
BNDG ESMARK 6X9 LF (GAUZE/BANDAGES/DRESSINGS) ×3
BOWL SMART MIX CTS (DISPOSABLE) ×3 IMPLANT
CAPT KNEE TOTAL 3 ATTUNE ×3 IMPLANT
CEMENT HV SMART SET (Cement) ×6 IMPLANT
CLOSURE WOUND 1/2 X4 (GAUZE/BANDAGES/DRESSINGS) ×1
COVER SURGICAL LIGHT HANDLE (MISCELLANEOUS) ×3 IMPLANT
CUFF TOURNIQUET SINGLE 34IN LL (TOURNIQUET CUFF) ×3 IMPLANT
CUFF TOURNIQUET SINGLE 44IN (TOURNIQUET CUFF) IMPLANT
DECANTER SPIKE VIAL GLASS SM (MISCELLANEOUS) ×3 IMPLANT
DRAPE EXTREMITY T 121X128X90 (DRAPE) ×3 IMPLANT
DRAPE INCISE IOBAN 66X45 STRL (DRAPES) ×3 IMPLANT
DRAPE PROXIMA HALF (DRAPES) ×3 IMPLANT
DRAPE U-SHAPE 47X51 STRL (DRAPES) ×3 IMPLANT
DRSG AQUACEL AG ADV 3.5X14 (GAUZE/BANDAGES/DRESSINGS) ×3 IMPLANT
DURAPREP 26ML APPLICATOR (WOUND CARE) ×6 IMPLANT
ELECT CAUTERY BLADE 6.4 (BLADE) ×3 IMPLANT
ELECT REM PT RETURN 9FT ADLT (ELECTROSURGICAL) ×3
ELECTRODE REM PT RTRN 9FT ADLT (ELECTROSURGICAL) ×1 IMPLANT
FACESHIELD WRAPAROUND (MASK) ×3 IMPLANT
GLOVE BIO SURGEON STRL SZ7 (GLOVE) ×3 IMPLANT
GLOVE BIOGEL PI IND STRL 7.0 (GLOVE) ×1 IMPLANT
GLOVE BIOGEL PI IND STRL 7.5 (GLOVE) ×1 IMPLANT
GLOVE BIOGEL PI INDICATOR 7.0 (GLOVE) ×2
GLOVE BIOGEL PI INDICATOR 7.5 (GLOVE) ×2
GLOVE SS BIOGEL STRL SZ 7.5 (GLOVE) ×1 IMPLANT
GLOVE SUPERSENSE BIOGEL SZ 7.5 (GLOVE) ×2
GOWN STRL REUS W/ TWL LRG LVL3 (GOWN DISPOSABLE) ×1 IMPLANT
GOWN STRL REUS W/ TWL XL LVL3 (GOWN DISPOSABLE) ×2 IMPLANT
GOWN STRL REUS W/TWL LRG LVL3 (GOWN DISPOSABLE) ×2
GOWN STRL REUS W/TWL XL LVL3 (GOWN DISPOSABLE) ×4
HANDPIECE INTERPULSE COAX TIP (DISPOSABLE) ×2
HOOD PEEL AWAY FACE SHEILD DIS (HOOD) ×6 IMPLANT
IMMOBILIZER KNEE 22 UNIV (SOFTGOODS) ×3 IMPLANT
KIT BASIN OR (CUSTOM PROCEDURE TRAY) ×3 IMPLANT
KIT ROOM TURNOVER OR (KITS) ×3 IMPLANT
MANIFOLD NEPTUNE II (INSTRUMENTS) ×3 IMPLANT
MARKER SKIN DUAL TIP RULER LAB (MISCELLANEOUS) ×3 IMPLANT
NEEDLE 18GX1X1/2 (RX/OR ONLY) (NEEDLE) ×3 IMPLANT
NS IRRIG 1000ML POUR BTL (IV SOLUTION) ×3 IMPLANT
PACK TOTAL JOINT (CUSTOM PROCEDURE TRAY) ×3 IMPLANT
PAD ARMBOARD 7.5X6 YLW CONV (MISCELLANEOUS) ×6 IMPLANT
SET HNDPC FAN SPRY TIP SCT (DISPOSABLE) ×1 IMPLANT
STRIP CLOSURE SKIN 1/2X4 (GAUZE/BANDAGES/DRESSINGS) ×2 IMPLANT
SUCTION FRAZIER HANDLE 10FR (MISCELLANEOUS) ×2
SUCTION TUBE FRAZIER 10FR DISP (MISCELLANEOUS) ×1 IMPLANT
SUT MNCRL AB 3-0 PS2 18 (SUTURE) ×3 IMPLANT
SUT VIC AB 0 CT1 27 (SUTURE) ×4
SUT VIC AB 0 CT1 27XBRD ANBCTR (SUTURE) ×2 IMPLANT
SUT VIC AB 1 CT1 27 (SUTURE) ×2
SUT VIC AB 1 CT1 27XBRD ANBCTR (SUTURE) ×1 IMPLANT
SUT VIC AB 1 CTX 36 (SUTURE) ×2
SUT VIC AB 1 CTX36XBRD ANBCTR (SUTURE) ×1 IMPLANT
SUT VIC AB 2-0 CT1 27 (SUTURE) ×4
SUT VIC AB 2-0 CT1 TAPERPNT 27 (SUTURE) ×2 IMPLANT
SYR 30ML LL (SYRINGE) ×3 IMPLANT
TOWEL OR 17X24 6PK STRL BLUE (TOWEL DISPOSABLE) ×3 IMPLANT
TOWEL OR 17X26 10 PK STRL BLUE (TOWEL DISPOSABLE) ×3 IMPLANT
TRAY FOLEY CATH 16FR SILVER (SET/KITS/TRAYS/PACK) ×3 IMPLANT
TUBE CONNECTING 12'X1/4 (SUCTIONS) ×1
TUBE CONNECTING 12X1/4 (SUCTIONS) ×2 IMPLANT
YANKAUER SUCT BULB TIP NO VENT (SUCTIONS) ×3 IMPLANT

## 2016-02-23 NOTE — Transfer of Care (Signed)
Immediate Anesthesia Transfer of Care Note  Patient: Sharon Ochoa  Procedure(s) Performed: Procedure(s): RIGHT TOTAL KNEE ARTHROPLASTY (Right)  Patient Location: PACU  Anesthesia Type:Spinal and MAC combined with regional for post-op pain  Level of Consciousness: awake, alert  and oriented  Airway & Oxygen Therapy: Patient Spontanous Breathing and Patient connected to nasal cannula oxygen  Post-op Assessment: Report given to RN and Post -op Vital signs reviewed and stable  Post vital signs: Reviewed and stable  Last Vitals:  Filed Vitals:   02/23/16 0814  BP: 168/89  Pulse: 86  Temp: 36.8 C  Resp: 18    Last Pain: There were no vitals filed for this visit.    Patients Stated Pain Goal: 2 ([id number])  Complications: No apparent anesthesia complications

## 2016-02-23 NOTE — Anesthesia Preprocedure Evaluation (Addendum)
Anesthesia Evaluation  Patient identified by MRN, date of birth, ID band Patient awake    Reviewed: Allergy & Precautions, NPO status , Patient's Chart, lab work & pertinent test results  History of Anesthesia Complications Negative for: history of anesthetic complications  Airway Mallampati: II  TM Distance: >3 FB Neck ROM: Full    Dental no notable dental hx. (+) Dental Advisory Given   Pulmonary neg pulmonary ROS,    Pulmonary exam normal breath sounds clear to auscultation       Cardiovascular hypertension, Pt. on medications Normal cardiovascular exam Rhythm:Regular Rate:Normal     Neuro/Psych PSYCHIATRIC DISORDERS Anxiety negative neurological ROS     GI/Hepatic Neg liver ROS, GERD  ,  Endo/Other  diabetesobesity  Renal/GU negative Renal ROS  negative genitourinary   Musculoskeletal  (+) Arthritis , Osteoarthritis,    Abdominal   Peds negative pediatric ROS (+)  Hematology negative hematology ROS (+)   Anesthesia Other Findings   Reproductive/Obstetrics negative OB ROS                            Anesthesia Physical Anesthesia Plan  ASA: II  Anesthesia Plan: Spinal   Post-op Pain Management: GA combined w/ Regional for post-op pain   Induction:   Airway Management Planned:   Additional Equipment:   Intra-op Plan:   Post-operative Plan:   Informed Consent: I have reviewed the patients History and Physical, chart, labs and discussed the procedure including the risks, benefits and alternatives for the proposed anesthesia with the patient or authorized representative who has indicated his/her understanding and acceptance.   Dental advisory given  Plan Discussed with: CRNA  Anesthesia Plan Comments:         Anesthesia Quick Evaluation

## 2016-02-23 NOTE — Interval H&P Note (Signed)
History and Physical Interval Note:  02/23/2016 6:36 AM  Sharon Ochoa  has presented today for surgery, with the diagnosis of PRIMARY LOCALIZED OA RIGHT KNEE  The various methods of treatment have been discussed with the patient and family. After consideration of risks, benefits and other options for treatment, the patient has consented to  Procedure(s): RIGHT TOTAL KNEE ARTHROPLASTY (Right) as a surgical intervention .  The patient's history has been reviewed, patient examined, no change in status, stable for surgery.  I have reviewed the patient's chart and labs.  Questions were answered to the patient's satisfaction.     Elsie Saas A

## 2016-02-23 NOTE — Progress Notes (Signed)
02/23/16  1900  Ancef IV not given d/t patient did not receive Ancef pre op and order states if not given pre op hold. Vancomycin was given pre op. Spoke with Charge Rn she advised to hold Ancef and page on call MD. Ancef held and on call MD paged. Notified Night RN to look for MD response.

## 2016-02-23 NOTE — Op Note (Signed)
MRN:     [id number] DOB/AGE:    01/17/1946 / 70 y.o.       OPERATIVE REPORT    DATE OF PROCEDURE:  02/23/2016       PREOPERATIVE DIAGNOSIS:  PRIMARY LOCALIZED OSTEOARTHRITIS RIGHT KNEE      Estimated body mass index is 34.45 kg/(m^2) as calculated from the following:   Height as of this encounter: 5\' 5"  (1.651 m).   Weight as of this encounter: 93.895 kg (207 lb).                                                        POSTOPERATIVE DIAGNOSIS:   SAME                                                                  PROCEDURE:  Procedure(s): RIGHT TOTAL KNEE ARTHROPLASTY Using Depuy Attune RP implants #5 Femur, #5Tibia, 21mm  RP bearing, 29 Patella     SURGEON: Seona Clemenson A    ASSISTANT:  Kirstin Shepperson PA-C   (Present and scrubbed throughout the case, critical for assistance with exposure, retraction, instrumentation, and closure.)         ANESTHESIA: Spinal with Adductor Nerve Block     TOURNIQUET TIME: 0000000   COMPLICATIONS:  None     SPECIMENS: None   INDICATIONS FOR PROCEDURE: The patient has  OSTEOARTHRITIS RIGHT KNEE, varus deformities, XR shows bone on bone arthritis. Patient has failed all conservative measures including anti-inflammatory medicines, narcotics, attempts at  exercise and weight loss, cortisone injections and viscosupplementation.  Risks and benefits of surgery have been discussed, questions answered.   DESCRIPTION OF PROCEDURE: The patient identified by armband, received  right femoral nerve block and IV antibiotics, in the holding area at [facility]. Patient taken to the operating room, appropriate anesthetic  monitors were attached General endotracheal anesthesia induced with  the patient in supine position, Foley catheter was inserted. Tourniquet  applied high to the operative thigh. Lateral post and foot positioner  applied to the table, the lower extremity was then prepped and draped  in usual sterile fashion from the ankle to the  tourniquet. Time-out procedure was performed. The limb was wrapped with an Esmarch bandage and the tourniquet inflated to 365 mmHg. We began the operation by making the anterior midline incision starting at handbreadth above the patella going over the patella 1 cm medial to and  4 cm distal to the tibial tubercle. Small bleeders in the skin and the  subcutaneous tissue identified and cauterized. Transverse retinaculum was incised and reflected medially and a medial parapatellar arthrotomy was accomplished. the patella was everted and theprepatellar fat pad resected. The superficial medial collateral  ligament was then elevated from anterior to posterior along the proximal  flare of the tibia and anterior half of the menisci resected. The knee was hyperflexed exposing bone on bone arthritis. Peripheral and notch osteophytes as well as the cruciate ligaments were then resected. We continued to  work our way around posteriorly along the proximal tibia, and externally  rotated the tibia subluxing it out from underneath the  femur. A McHale  retractor was placed through the notch and a lateral Hohmann retractor  placed, and we then drilled through the proximal tibia in line with the  axis of the tibia followed by an intramedullary guide rod and 2-degree  posterior slope cutting guide. The tibial cutting guide was pinned into place  allowing resection of 4 mm of bone medially and about 6 mm of bone  laterally because of her varus deformity. Satisfied with the tibial resection, we then  entered the distal femur 2 mm anterior to the PCL origin with the  intramedullary guide rod and applied the distal femoral cutting guide  set at 51mm, with 5 degrees of valgus. This was pinned along the  epicondylar axis. At this point, the distal femoral cut was accomplished without difficulty. We then sized for a #5 femoral component and pinned the guide in 3 degrees of external rotation.The chamfer cutting guide was pinned  into place. The anterior, posterior, and chamfer cuts were accomplished without difficulty followed by  the  RP box cutting guide and the box cut. We also removed posterior osteophytes from the posterior femoral condyles. At this  time, the knee was brought into full extension. We checked our  extension and flexion gaps and found them symmetric at 2mm.  The patella thickness measured at 21 mm. We set the cutting guide at 13 and removed the posterior 8 mm  of the patella sized for 29 button and drilled the lollipop. The knee  was then once again hyperflexed exposing the proximal tibia. We sized for a #5 tibial base plate, applied the smokestack and the conical reamer followed by the the Delta fin keel punch. We then hammered into place the  RP trial femoral component, inserted a 1 trial bearing, trial patellar button, and took the knee through range of motion from 0-130 degrees. No thumb pressure was required for patellar  tracking. At this point, all trial components were removed, a double batch of DePuy HV cement  was mixed and applied to all bony metallic mating surfaces except for the posterior condyles of the femur itself. In order, we  hammered into place the tibial tray and removed excess cement, the femoral component and removed excess cement, a 69mm  RP bearing  was inserted, and the knee brought to full extension with compression.  The patellar button was clamped into place, and excess cement  removed. While the cement cured the wound was irrigated out with normal saline solution pulse lavage.. Ligament stability and patellar tracking were checked and found to be excellent.. The parapatellar arthrotomy was closed with  #1 Vicryl suture. The subcutaneous tissue with 0 and 2-0 undyed  Vicryl suture, and 4-0 Monocryl.. A dressing of Aquaseal,  4 x 4, dressing sponges, Webril, and Ace wrap applied. Needle and sponge count were correct times 2.The patient awakened, extubated, and taken to recovery  room without difficulty. Vascular status was normal, pulses 2+ and symmetric.   [name] A 02/23/2016, 12:04 PM

## 2016-02-23 NOTE — Progress Notes (Signed)
02/23/16  1950  Spoke with on call [place], [place] regarding holding the Ancef. Per Ria Comment ok to hold Ancef and reorder Vancomycin for tonight.  Vancomycin was reordered for tonight.

## 2016-02-23 NOTE — Progress Notes (Signed)
Orthopedic Tech Progress Note Patient Details:  Sharon Ochoa 29-Aug-1946 [id number]  CPM Right Knee CPM Right Knee: On Right Knee Flexion (Degrees): 90 Right Knee Extension (Degrees): 0 Additional Comments: Trapeze bar and foot roll   [place] Pink 02/23/2016, 1:57 PM

## 2016-02-23 NOTE — Anesthesia Procedure Notes (Addendum)
Anesthesia Regional Block:  Adductor canal block  Pre-Anesthetic Checklist: ,, timeout performed, Correct Patient, Correct Site, Correct Laterality, Correct Procedure, Correct Position, site marked, Risks and benefits discussed,  Surgical consent,  Pre-op evaluation,  At surgeon's request and post-op pain management  Laterality: Right  Prep: Maximum Sterile Barrier Precautions used and chloraprep       Needles:  Injection technique: Single-shot  Needle Type: Echogenic Stimulator Needle     Needle Length: 10cm 10 cm Needle Gauge: 21 and 21 G    Additional Needles:  Procedures: ultrasound guided (picture in chart) and nerve stimulator Adductor canal block Narrative:  Injection made incrementally with aspirations every 5 mL.  Performed by: Personally  Anesthesiologist: Nyilah Kight  Additional Notes: Patient tolerated the procedure well without complications   Spinal Patient location during procedure: OR Staffing Anesthesiologist: Brianny Soulliere Performed by: anesthesiologist  Preanesthetic Checklist Completed: patient identified, site marked, surgical consent, pre-op evaluation, timeout performed, IV checked, risks and benefits discussed and monitors and equipment checked Spinal Block Patient position: sitting Prep: ChloraPrep Patient monitoring: continuous pulse ox, blood pressure and heart rate Approach: midline Injection technique: single-shot Needle Needle type: Sprotte  Needle gauge: 24 G Needle length: 9 cm Additional Notes Functioning IV was confirmed and monitors were applied. Sterile prep and drape, including hand hygiene, mask and sterile gloves were used. The patient was positioned and the spine was prepped. The skin was anesthetized with lidocaine.  Free flow of clear CSF was obtained prior to injecting local anesthetic into the CSF.  The spinal needle aspirated freely following injection.  The needle was carefully withdrawn.  The patient tolerated the procedure  well. Consent was obtained prior to procedure with all questions answered and concerns addressed. Risks including but not limited to bleeding, infection, nerve damage, paralysis, failed block, inadequate analgesia, allergic reaction, high spinal, itching and headache were discussed and the patient wished to proceed.   Lauretta Grill, MD

## 2016-02-23 NOTE — Anesthesia Postprocedure Evaluation (Signed)
Anesthesia Post Note  Patient: Sharon Ochoa  Procedure(s) Performed: Procedure(s) (LRB): RIGHT TOTAL KNEE ARTHROPLASTY (Right)  Patient location during evaluation: PACU Anesthesia Type: Spinal Level of consciousness: awake and alert Pain management: pain level controlled Vital Signs Assessment: post-procedure vital signs reviewed and stable Respiratory status: spontaneous breathing, nonlabored ventilation, respiratory function stable and patient connected to nasal cannula oxygen Cardiovascular status: blood pressure returned to baseline and stable Postop Assessment: no signs of nausea or vomiting Anesthetic complications: no    Last Vitals:  Filed Vitals:   02/23/16 0814 02/23/16 1234  BP: 168/89 112/75  Pulse: 86 75  Temp: 36.8 C   Resp: 18 29    Last Pain: There were no vitals filed for this visit.               Ameliya Nicotra JENNETTE

## 2016-02-24 ENCOUNTER — Encounter (HOSPITAL_COMMUNITY): Payer: Self-pay | Admitting: Physician Assistant

## 2016-02-24 DIAGNOSIS — N390 Urinary tract infection, site not specified: Secondary | ICD-10-CM

## 2016-02-24 HISTORY — DX: Urinary tract infection, site not specified: N39.0

## 2016-02-24 LAB — BASIC METABOLIC PANEL
Anion gap: 9 (ref 5–15)
BUN: 15 mg/dL (ref 6–20)
CALCIUM: 8.7 mg/dL — AB (ref 8.9–10.3)
CO2: 22 mmol/L (ref 22–32)
CREATININE: 0.77 mg/dL (ref 0.44–1.00)
Chloride: 101 mmol/L (ref 101–111)
GFR calc non Af Amer: >60 mL/min (ref >60)
Glucose, Bld: 241 mg/dL — ABNORMAL HIGH (ref 65–99)
Potassium: 4.3 mmol/L (ref 3.5–5.1)
SODIUM: 132 mmol/L — AB (ref 135–145)

## 2016-02-24 LAB — CBC
HCT: 32.1 % — ABNORMAL LOW (ref 36.0–46.0)
HEMOGLOBIN: 10.9 g/dL — AB (ref 12.0–15.0)
MCH: 30.1 pg (ref 26.0–34.0)
MCHC: 34 g/dL (ref 30.0–36.0)
MCV: 88.7 fL (ref 78.0–100.0)
PLATELETS: 178 10*3/uL (ref 150–400)
RBC: 3.62 MIL/uL — ABNORMAL LOW (ref 3.87–5.11)
RDW: 13 % (ref 11.5–15.5)
WBC: 15.6 10*3/uL — ABNORMAL HIGH (ref 4.0–10.5)

## 2016-02-24 LAB — GLUCOSE, CAPILLARY
GLUCOSE-CAPILLARY: 257 mg/dL — AB (ref 65–99)
GLUCOSE-CAPILLARY: 235 mg/dL — AB (ref 65–99)
Glucose-Capillary: 247 mg/dL — ABNORMAL HIGH (ref 65–99)
Glucose-Capillary: 291 mg/dL — ABNORMAL HIGH (ref 65–99)
Glucose-Capillary: 218 mg/dL — ABNORMAL HIGH (ref 65–99)

## 2016-02-24 LAB — HEMOGLOBIN A1C
HEMOGLOBIN A1C: 7.6 % — AB (ref 4.8–5.6)
MEAN PLASMA GLUCOSE: 171 mg/dL

## 2016-02-24 MED ORDER — CEFAZOLIN SODIUM-DEXTROSE 2-4 GM/100ML-% IV SOLN
2.0000 g | Freq: Three times a day (TID) | INTRAVENOUS | Status: DC
  Administered 2016-02-24 – 2016-02-25 (×4): 2 g via INTRAVENOUS
  Filled 2016-02-24 (×8): qty 100

## 2016-02-24 MED ORDER — INSULIN GLARGINE 100 UNIT/ML ~~LOC~~ SOLN
25.0000 [IU] | Freq: Every day | SUBCUTANEOUS | Status: DC
  Administered 2016-02-24: 25 [IU] via SUBCUTANEOUS
  Filled 2016-02-24 (×2): qty 0.25

## 2016-02-24 MED ORDER — DIAZEPAM 2 MG PO TABS
2.0000 mg | ORAL_TABLET | Freq: Four times a day (QID) | ORAL | Status: DC | PRN
  Administered 2016-02-24 – 2016-02-25 (×4): 2 mg via ORAL
  Filled 2016-02-24 (×4): qty 1

## 2016-02-24 NOTE — Evaluation (Signed)
Physical Therapy Evaluation Patient Details Name: Sharon Ochoa MRN: [id number] DOB: [birth year] Today's Date: 02/24/2016   History of Present Illness  Pt is a 70 y.o. female no s/p Rt TKA. PMH: diabetes, hypertension, osteoporosis, anxiety.   Clinical Impression  Pt is s/p TKA resulting in the deficits listed below (see PT Problem List).  Pt will benefit from skilled PT to increase their independence and safety with mobility to allow discharge to home with daughter's assistance. Will follow and progress mobility as tolerated.      Follow Up Recommendations Home health PT;Supervision for mobility/OOB    Equipment Recommendations  None recommended by PT (pt has already arranged)    Recommendations for Other Services       Precautions / Restrictions Precautions Precautions: Knee;Fall Precaution Booklet Issued: Yes (comment) Precaution Comments: HEP provided, reviewed knee extension precautions Required Braces or Orthoses: Knee Immobilizer - Right Restrictions Weight Bearing Restrictions: Yes RLE Weight Bearing: Weight bearing as tolerated      Mobility  Bed Mobility               General bed mobility comments: up in chair upon arrival  Transfers Overall transfer level: Needs assistance Equipment used: Rolling walker (2 wheeled) Transfers: Sit to/from Stand Sit to Stand: Min guard         General transfer comment: reports feeling unsteady with initial stand, no loss of balance.   Ambulation/Gait Ambulation/Gait assistance: Min guard Ambulation Distance (Feet): 125 Feet Assistive device: Rolling walker (2 wheeled) Gait Pattern/deviations: Step-through pattern;Decreased weight shift to right Gait velocity: decreased   General Gait Details: steady pattern, no loss of balance. distance limited by fatigue.   Stairs            Wheelchair Mobility    Modified Rankin (Stroke Patients Only)       Balance Overall balance assessment: Needs  assistance Sitting-balance support: No upper extremity supported Sitting balance-Leahy Scale: Good     Standing balance support: Bilateral upper extremity supported Standing balance-Leahy Scale: Fair Standing balance comment: using rw for support                             Pertinent Vitals/Pain Pain Assessment: 0-10 Pain Score: 6  Pain Location: Rt knee Pain Descriptors / Indicators: Sore;Aching Pain Intervention(s): Limited activity within patient's tolerance;Monitored during session;Ice applied    Home Living Family/patient expects to be discharged to:: Private residence Living Arrangements: Alone Available Help at Discharge: Family;Available 24 hours/day Type of Home: House Home Access: Stairs to enter Entrance Stairs-Rails: None Entrance Stairs-Number of Steps: 2 Home Layout: One level Home Equipment: Walker - 2 wheels Additional Comments: daughter is going to be staying with pt upon d/c.     Prior Function Level of Independence: Independent               Hand Dominance        Extremity/Trunk Assessment   Upper Extremity Assessment: Defer to OT evaluation           Lower Extremity Assessment: RLE deficits/detail RLE Deficits / Details: fair quad activation       Communication   Communication: No difficulties  Cognition Arousal/Alertness: Awake/alert Behavior During Therapy: WFL for tasks assessed/performed Overall Cognitive Status: Within Functional Limits for tasks assessed                      General Comments      Exercises Total  Joint Exercises Ankle Circles/Pumps: AROM;Both;10 reps Quad Sets: Strengthening;Right;10 reps Heel Slides: AAROM;Right;10 reps Goniometric ROM: approx. 90 degrees flexion.      Assessment/Plan    PT Assessment Patient needs continued PT services  PT Diagnosis Difficulty walking   PT Problem List Decreased strength;Decreased range of motion;Decreased activity tolerance;Decreased  balance;Decreased coordination;Decreased mobility  PT Treatment Interventions DME instruction;Balance training;Therapeutic activities;Functional mobility training;Stair training;Gait training;Therapeutic exercise   PT Goals (Current goals can be found in the Care Plan section) Acute Rehab PT Goals Patient Stated Goal: get back home PT Goal Formulation: With patient Time For Goal Achievement: 03/09/16 Potential to Achieve Goals: Good    Frequency 7X/week   Barriers to discharge        Co-evaluation               End of Session Equipment Utilized During Treatment: Gait belt (pt declined KI, reports not using with PA-C earlier today. ) Activity Tolerance: Patient limited by fatigue;Patient tolerated treatment well Patient left: in chair;with call bell/phone within reach;with family/visitor present;Other (comment) (in knee extension) Nurse Communication: Mobility status;Weight bearing status         Time: [id number] PT Time Calculation (min) (ACUTE ONLY): 27 min   Charges:   PT Evaluation $PT Eval Moderate Complexity: 1 Procedure PT Treatments $Gait Training: 8-22 mins   PT G Codes:        Cassell Clement, PT, CSCS Pager [phone] Office [phone]  02/24/2016, 12:34 PM

## 2016-02-24 NOTE — Evaluation (Signed)
Occupational Therapy Evaluation Patient Details Name: Sharon Ochoa MRN: [id number] DOB: Mar 16, 1946 Today's Date: 02/24/2016    History of Present Illness Pt is a 70 y.o. female no s/p Rt TKA. PMH: diabetes, hypertension, osteoporosis, anxiety, arthritis.    Clinical Impression   Pt s/p above. Pt independent with ADLs, PTA. Feel pt will benefit from acute OT to increase independence prior to d/c. Do not feel pt will need follow up OT upon d/c. Pt wanting another session to review.    Follow Up Recommendations  No OT follow up;Supervision - Intermittent    Equipment Recommendations  None recommended by OT (daughter planning on purchasing shower chair)    Recommendations for Other Services       Precautions / Restrictions Precautions Precautions: Knee;Fall Precaution Booklet Issued: No Precaution Comments: educated on knee precautions Required Braces or Orthoses: Knee Immobilizer - Right (not used in session-pt able to perform 5 straight leg raises) Restrictions Weight Bearing Restrictions: Yes RLE Weight Bearing: Weight bearing as tolerated      Mobility Bed Mobility Overal bed mobility: Needs Assistance Bed Mobility: Supine to Sit;Sit to Supine     Supine to sit: Supervision Sit to supine: Supervision   Transfers Overall transfer level: Needs assistance Transfers: Sit to/from Stand Sit to Stand: Min guard         General transfer comment: RW in front of pt upon standing from bed    Balance Min assist for simulated shower transfer-stepped over simulated shower threshold. Min guard ambulating short distance in room without RW; Min guard ambulating with RW.                         ADL Overall ADL's : Needs assistance/impaired                     Lower Body Dressing: Min guard;Sit to/from stand   Toilet Transfer: Min guard;Ambulation;RW (sit to stand from bed)       Tub/ Shower Transfer: Walk-in shower;Minimal assistance;Ambulation (used  RW to ambulate close to simulated shower)   Functional mobility during ADLs: Min guard;Rolling walker (ambulated short distance without RW-Min guard) General ADL Comments: Educated on LB dressing technique. Educated on safety such as use of bag on walker, rugs/items on floor, sitting for LB ADLs, and recommended someone be with her for shower transfer and close by for bathing. Educated on shower transfer technique and pt practiced.      Vision     Perception     Praxis      Pertinent Vitals/Pain Pain Assessment: 0-10 Pain Score:  (3-4) Pain Location: Rt LE Pain Descriptors / Indicators: Aching;Tightness;Burning Pain Intervention(s): Monitored during session     Hand Dominance     Extremity/Trunk Assessment Upper Extremity Assessment Upper Extremity Assessment: Overall WFL for tasks assessed   Lower Extremity Assessment Lower Extremity Assessment: Defer to PT evaluation RLE Deficits / Details: able to perform 5 straight leg raises; decreased AROM knee flexion       Communication Communication Communication: No difficulties   Cognition Arousal/Alertness: Awake/alert Behavior During Therapy: WFL for tasks assessed/performed Overall Cognitive Status: Within Functional Limits for tasks assessed                     General Comments       Exercises Exercises: Other exercises Other Exercises Other Exercises: able to perform 5 right straight leg raises.   Shoulder Instructions  Home Living Family/patient expects to be discharged to:: Private residence Living Arrangements: Alone Available Help at Discharge: Family;Available 24 hours/day Type of Home: House Home Access: Stairs to enter [employer] of Steps: 2 Entrance Stairs-Rails: None Home Layout: One level     Bathroom Shower/Tub: Walk-in shower;Door   Bathroom Toilet:  (elevated toilet )     Home Equipment: Walker - 2 wheels (borrowing 3 in 1)   Additional Comments: daughter is going  to be staying with pt upon d/c.       Prior Functioning/Environment Level of Independence: Needs assistance    ADL's / Homemaking Assistance Needed: assist with yardwork.        OT Diagnosis: Acute pain   OT Problem List: Decreased strength;Decreased range of motion;Decreased activity tolerance;Impaired balance (sitting and/or standing);Pain;Decreased knowledge of precautions;Decreased knowledge of use of DME or AE   OT Treatment/Interventions: Self-care/ADL training;DME and/or AE instruction;Patient/family education;Balance training;Therapeutic activities    OT Goals(Current goals can be found in the care plan section) Acute Rehab OT Goals Patient Stated Goal: not stated OT Goal Formulation: With patient Time For Goal Achievement: 03/02/16 Potential to Achieve Goals: Good ADL Goals Pt Will Perform Lower Body Dressing: with supervision;sit to/from stand (including gathering items) Pt Will Transfer to Toilet: ambulating;with set-up (using RW; 3 in 1 over commode) Pt Will Perform Tub/Shower Transfer: Shower transfer;with caregiver independent in assisting;ambulating  OT Frequency: Min 2X/week   Barriers to D/C:            Co-evaluation              End of Session Equipment Utilized During Treatment: Rolling walker;Gait belt CPM Right Knee CPM Right Knee: Off  Activity Tolerance: Patient tolerated treatment well Patient left: in bed;with call bell/phone within reach;with family/visitor present   Time: [id number] OT Time Calculation (min): 31 min Charges:  OT General Charges $OT Visit: 1 Procedure OT Evaluation $OT Eval Moderate Complexity: 1 Procedure OT Treatments $Self Care/Home Management : 8-22 mins G-CodesBenito Mccreedy OTR/L [id number] 02/24/2016, 3:15 PM

## 2016-02-24 NOTE — Progress Notes (Signed)
Physical Therapy Treatment Patient Details Name: Sharon Ochoa MRN: [id number] DOB: 1946-03-31 Today's Date: 02/24/2016    History of Present Illness Pt is a 70 y.o. female no s/p Rt TKA. PMH: diabetes, hypertension, osteoporosis, anxiety, arthritis.     PT Comments    Pt making steady progress with PT regarding mobility. Anticipate pt will D/C to home with her daughter's assistance when discharged. PT to continue to follow and progress mobility as tolerated.   Follow Up Recommendations  Home health PT;Supervision for mobility/OOB     Equipment Recommendations  None recommended by PT    Recommendations for Other Services       Precautions / Restrictions Precautions Precautions: Knee;Fall Precaution Booklet Issued: No Precaution Comments: educated on knee precautions Required Braces or Orthoses: Knee Immobilizer - Right Restrictions Weight Bearing Restrictions: Yes RLE Weight Bearing: Weight bearing as tolerated    Mobility  Bed Mobility Overal bed mobility: Needs Assistance Bed Mobility: Supine to Sit     Supine to sit: Supervision (with rail) Sit to supine: Supervision   General bed mobility comments: up in chair upon arrival  Transfers Overall transfer level: Needs assistance Equipment used: Rolling walker (2 wheeled) Transfers: Sit to/from Stand Sit to Stand: Supervision         General transfer comment: safe technique  Ambulation/Gait Ambulation/Gait assistance: Min guard Ambulation Distance (Feet): 200 Feet Assistive device: Rolling walker (2 wheeled) Gait Pattern/deviations: Step-through pattern Gait velocity: decreased   General Gait Details: cues for even weight shift   Stairs            Wheelchair Mobility    Modified Rankin (Stroke Patients Only)       Balance Overall balance assessment: Needs assistance Sitting-balance support: No upper extremity supported Sitting balance-Leahy Scale: Good     Standing balance support:  During functional activity Standing balance-Leahy Scale: Fair Standing balance comment: using rw                    Cognition Arousal/Alertness: Awake/alert Behavior During Therapy: WFL for tasks assessed/performed Overall Cognitive Status: Within Functional Limits for tasks assessed                      Exercises Total Joint Exercises Ankle Circles/Pumps: AROM;Both;10 reps Quad Sets: Strengthening;Right;10 reps Short Arc Quad: Strengthening;Right;10 reps Heel Slides: AAROM;Right;10 reps Hip ABduction/ADduction: Strengthening;Right;10 reps Straight Leg Raises: Strengthening;Right;10 reps Goniometric ROM: approx. 90 degrees flexion. Other Exercises Other Exercises: able to perform 5 right straight leg raises.    General Comments        Pertinent Vitals/Pain Pain Assessment: 0-10 Pain Score: 3  Pain Location: Rt knee Pain Descriptors / Indicators: Sore Pain Intervention(s): Monitored during session;Ice applied    Home Living   Prior Function          PT Goals (current goals can now be found in the care plan section) Acute Rehab PT Goals Patient Stated Goal: get back home again PT Goal Formulation: With patient Time For Goal Achievement: 03/09/16 Potential to Achieve Goals: Good Progress towards PT goals: Progressing toward goals    Frequency  7X/week    PT Plan Current plan remains appropriate    Co-evaluation             End of Session Equipment Utilized During Treatment: Gait belt Activity Tolerance: Patient tolerated treatment well Patient left: in chair;with call bell/phone within reach;with family/visitor present (in bone foam)     [id number] PT Time Calculation (  min) (ACUTE ONLY): 26 min  Charges:  $Gait Training: 8-22 mins $Therapeutic Exercise: 8-22 mins                    G Codes:      Cassell Clement, PT, CSCS Pager [phone] Office [phone]  02/24/2016, 4:04 PM

## 2016-02-24 NOTE — Progress Notes (Signed)
Subjective: 1 Day Post-Op Procedure(s) (LRB): RIGHT TOTAL KNEE ARTHROPLASTY (Right) Patient reports pain as 3 on 0-10 scale.    Objective: Vital signs in last 24 hours: Temp:  [97.4 F (36.3 C)-98.8 F (37.1 C)] 98.8 F (37.1 C) (05/02 0500) Pulse Rate:  [54-118] 86 (05/02 0500) Resp:  [9-29] 18 (05/02 0500) BP: (100-161)/(50-97) 138/62 mmHg (05/02 0500) SpO2:  [91 %-100 %] 92 % (05/02 0500)  Intake/Output from previous day: 05/01 0701 - 05/02 0700 In: 3315 [P.O.:1100; I.V.:2215] Out: 1965 [id number]; Blood:15] Intake/Output this shift: Total I/O In: 240 [P.O.:240] Out: -    Recent Labs  02/21/16 2338 02/24/16 0522  HGB 14.4 10.9*    Recent Labs  02/21/16 2338 02/24/16 0522  WBC 13.5* 15.6*  RBC 4.76 3.62*  HCT 40.6 32.1*  PLT 225 178    Recent Labs  02/21/16 2338 02/24/16 0522  NA 138 132*  K 3.9 4.3  CL 104 101  CO2 25 22  BUN 19 15  CREATININE 0.79 0.77  GLUCOSE 233* 241*  CALCIUM 9.9 8.7*   No results for input(s): LABPT, INR in the last 72 hours.  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

## 2016-02-24 NOTE — Care Management Note (Signed)
Case Management Note  Patient Details  Name: Sharon Ochoa MRN: [id number] Date of Birth: [birth year]  Subjective/Objective:      70 yr old female s/p right total knee arthroplasty.              Action/Plan: Case manager spoke with patient at the bedside concerning discharge plans and DME needs. Patient was preoperatively setup with [facility], no changes to that. Patient states that she is borrowing a walker and 3in1 from a friend. CPM will be delivered to her home. Patient will have family support at discharge.   Expected Discharge Date:    02/26/16. \           Expected Discharge Plan:  [place]  In-House Referral:     Discharge planning Services  CM Consult  Post Acute Care Choice:  Home Health Choice offered to:     DME Arranged:  N/A DME Agency:     HH Arranged:  NA HH Agency:  [facility]  Status of Service:  Completed, signed off  Medicare Important Message Given:    Date Medicare IM Given:    Medicare IM give by:    Date Additional Medicare IM Given:    Additional Medicare Important Message give by:     If discussed at [place] of Stay Meetings, dates discussed:    Additional Comments:  Ninfa Meeker, RN 02/24/2016, 2:36 PM

## 2016-02-24 NOTE — Progress Notes (Signed)
Patient seen on rounds with PA.  Her discharge plan is to return to home with her daughter to assist. She is set up with [facility] for HHPT follow up. She has borrowed all needed equipment (rolling walker and 3N1). Her CPM will be set up at home on discharge. She has been seen by a [employer] rep.

## 2016-02-25 LAB — GLUCOSE, CAPILLARY
GLUCOSE-CAPILLARY: 294 mg/dL — AB (ref 65–99)
GLUCOSE-CAPILLARY: 206 mg/dL — AB (ref 65–99)

## 2016-02-25 LAB — BASIC METABOLIC PANEL
Anion gap: 7 (ref 5–15)
BUN: 16 mg/dL (ref 6–20)
CHLORIDE: 102 mmol/L (ref 101–111)
CO2: 25 mmol/L (ref 22–32)
Calcium: 8.8 mg/dL — ABNORMAL LOW (ref 8.9–10.3)
Creatinine, Ser: 0.82 mg/dL (ref 0.44–1.00)
GFR calc Af Amer: >60 mL/min (ref >60)
GFR calc non Af Amer: >60 mL/min (ref >60)
GLUCOSE: 281 mg/dL — AB (ref 65–99)
POTASSIUM: 4.9 mmol/L (ref 3.5–5.1)
Sodium: 134 mmol/L — ABNORMAL LOW (ref 135–145)

## 2016-02-25 LAB — CBC
HCT: 31.7 % — ABNORMAL LOW (ref 36.0–46.0)
HEMOGLOBIN: 10.5 g/dL — AB (ref 12.0–15.0)
MCH: 28.9 pg (ref 26.0–34.0)
MCHC: 33.1 g/dL (ref 30.0–36.0)
MCV: 87.3 fL (ref 78.0–100.0)
Platelets: 203 10*3/uL (ref 150–400)
RBC: 3.63 MIL/uL — AB (ref 3.87–5.11)
RDW: 13.3 % (ref 11.5–15.5)
WBC: 19.8 10*3/uL — ABNORMAL HIGH (ref 4.0–10.5)

## 2016-02-25 MED ORDER — APIXABAN 2.5 MG PO TABS: ORAL_TABLET | ORAL | Status: AC

## 2016-02-25 MED ORDER — CEPHALEXIN 500 MG PO CAPS: 500.0000 mg | ORAL_CAPSULE | Freq: Four times a day (QID) | ORAL | Status: AC

## 2016-02-25 MED ORDER — POLYETHYLENE GLYCOL 3350 17 G PO PACK: PACK | ORAL | Status: AC

## 2016-02-25 MED ORDER — DOCUSATE SODIUM 100 MG PO CAPS: ORAL_CAPSULE | ORAL | Status: AC

## 2016-02-25 MED ORDER — OXYCODONE HCL 5 MG PO TABS: ORAL_TABLET | ORAL | Status: AC

## 2016-02-25 MED ORDER — DIAZEPAM 2 MG PO TABS: ORAL_TABLET | ORAL | Status: AC

## 2016-02-25 MED ORDER — ONDANSETRON HCL 4 MG PO TABS: ORAL_TABLET | ORAL | Status: AC

## 2016-02-25 NOTE — Progress Notes (Signed)
Physical Therapy Treatment Patient Details Name: Sharon Ochoa MRN: [id number] DOB: October 15, 1946 Today's Date: 02/25/2016    History of Present Illness Pt is a 70 y.o. female no s/p Rt TKA. PMH: diabetes, hypertension, osteoporosis, anxiety, arthritis.     PT Comments    Patient is making good progress with PT.  From a mobility standpoint anticipate patient will be ready for DC home with daughter to assist. Additional time spent reinforcing HEP with pt and daughter along with reviewing ambulation and stairs. Pt and daughter deny any questions or concerns following session.      Follow Up Recommendations  Home health PT;Supervision for mobility/OOB     Equipment Recommendations  None recommended by PT    Recommendations for Other Services       Precautions / Restrictions Precautions Precautions: Knee;Fall Restrictions Weight Bearing Restrictions: Yes RLE Weight Bearing: Weight bearing as tolerated    Mobility  Bed Mobility Overal bed mobility: Needs Assistance Bed Mobility: Supine to Sit     Supine to sit: Supervision        Transfers Overall transfer level: Needs assistance Equipment used: Rolling walker (2 wheeled) Transfers: Sit to/from Stand Sit to Stand: Supervision            Ambulation/Gait Ambulation/Gait assistance: Supervision Ambulation Distance (Feet): 125 Feet Assistive device: Rolling walker (2 wheeled) Gait Pattern/deviations: Step-through pattern;Decreased stance time - right;Decreased weight shift to right Gait velocity: decreased   General Gait Details: cues for even weight shift   Stairs   Stairs assistance: Min assist Stair Management: Forwards;Step to pattern Number of Stairs: 2 General stair comments: pt and daughter reports feeling confident with doing stairs at home. Daughter present and assisting with descending stairs.   Wheelchair Mobility    Modified Rankin (Stroke Patients Only)       Balance Overall balance  assessment: Needs assistance Sitting-balance support: No upper extremity supported Sitting balance-Leahy Scale: Good     Standing balance support: Bilateral upper extremity supported Standing balance-Leahy Scale: Fair Standing balance comment: using rw                    Cognition Arousal/Alertness: Awake/alert Behavior During Therapy: WFL for tasks assessed/performed Overall Cognitive Status: Within Functional Limits for tasks assessed                      Exercises Total Joint Exercises Ankle Circles/Pumps: AROM;Both;10 reps Quad Sets: Strengthening;Right;10 reps Short Arc Quad: Strengthening;Right;10 reps Heel Slides: AAROM;Right;10 reps Hip ABduction/ADduction: Strengthening;Right;10 reps Straight Leg Raises: Strengthening;Right;10 reps    General Comments        Pertinent Vitals/Pain Pain Assessment: 0-10 Pain Score: 6  Pain Location: rt knee Pain Descriptors / Indicators: Aching Pain Intervention(s): Limited activity within patient's tolerance;Monitored during session    Home Living                      Prior Function            PT Goals (current goals can now be found in the care plan section) Acute Rehab PT Goals Patient Stated Goal: to go home today PT Goal Formulation: With patient Time For Goal Achievement: 03/09/16 Potential to Achieve Goals: Good Progress towards PT goals: Progressing toward goals    Frequency  7X/week    PT Plan Current plan remains appropriate    Co-evaluation             End of Session Equipment Utilized During  Treatment: Gait belt Activity Tolerance: Patient tolerated treatment well Patient left: in chair;with call bell/phone within reach     Time: 1211-1249 PT Time Calculation (min) (ACUTE ONLY): 38 min  Charges:  $Gait Training: 8-22 mins $Therapeutic Exercise: 23-37 mins                    G Codes:      Cassell Clement, PT, CSCS Pager [phone] Office [phone]  02/25/2016, 3:59 PM

## 2016-02-25 NOTE — Progress Notes (Signed)
Occupational Therapy Treatment/Discharge Patient Details Name: Sharon Ochoa MRN: [id number] DOB: 1946/07/02 Today's Date: 02/25/2016    History of present illness Pt is a 70 y.o. female no s/p Rt TKA. PMH: diabetes, hypertension, osteoporosis, anxiety, arthritis.    OT comments  Pt progressing well and has achieved all acute OT goals. Reviewed knee precautions, compensatory strategies for ADLs and practiced simulated shower transfer for safe technique. Educated pt on pain/edema management, fall prevention and home safety strategies. All education has been completed and pt has no further questions. Pt with no further acute OT needs. OT signing off.   Follow Up Recommendations  No OT follow up;Supervision - Intermittent    Equipment Recommendations  None recommended by OT (daughter purchasing shower seat)    Recommendations for Other Services      Precautions / Restrictions Precautions Precautions: Knee;Fall Precaution Booklet Issued: No Precaution Comments: Educated on not placing pillow, ice pack or other object under knee Restrictions Weight Bearing Restrictions: Yes RLE Weight Bearing: Weight bearing as tolerated       Mobility Bed Mobility Overal bed mobility: Needs Assistance Bed Mobility: Supine to Sit     Supine to sit: Supervision     General bed mobility comments: HOB flat, no use of bedrails and exited on L side to simulate home environment.  Transfers Overall transfer level: Needs assistance Equipment used: Rolling walker (2 wheeled) Transfers: Sit to/from Stand Sit to Stand: Supervision         General transfer comment: No physical assist required. No overt LOB or reports of dizziness.    Balance Overall balance assessment: Needs assistance Sitting-balance support: No upper extremity supported;Feet supported Sitting balance-Leahy Scale: Good     Standing balance support: Bilateral upper extremity supported;During functional activity Standing  balance-Leahy Scale: Fair Standing balance comment: Able to maintain balance without UE support for static standing tasks                   ADL Overall ADL's : Needs assistance/impaired                 Upper Body Dressing : Modified independent;Sitting   Lower Body Dressing: Modified independent;Sit to/from stand Lower Body Dressing Details (indicate cue type and reason): Reviewed sitting for all dressing tasks Toilet Transfer: Supervision/safety;Ambulation;Regular Toilet;RW   Toileting- Clothing Manipulation and Hygiene: Supervision/safety;Sit to/from stand   Tub/ Shower Transfer: Walk-in shower;Supervision/safety;Cueing for safety;Ambulation;Rolling walker Tub/Shower Transfer Details (indicate cue type and reason): Practiced step sequence entering shower sideways due to pt's narrow shower at home. Agreed use of RW was necessary for this transfer and pt would be able to fit it in upstairs bathroom. Functional mobility during ADLs: Supervision/safety;Rolling walker General ADL Comments: Reviewed knee precautions, compensatory strategies for ADLs, pain/edema management and fall prevention/home safety strategies.       Vision                     Perception     Praxis      Cognition   Behavior During Therapy: [facility] for tasks assessed/performed Overall Cognitive Status: Within Functional Limits for tasks assessed                       Extremity/Trunk Assessment               Exercises Total Joint Exercises Ankle Circles/Pumps: AROM;Both;10 reps Quad Sets: Strengthening;Right;10 reps Short Arc Quad: Strengthening;Right;10 reps Heel Slides: AAROM;Right;10 reps Goniometric ROM: 91 degrees knee  flexion   Shoulder Instructions       General Comments      Pertinent Vitals/ Pain       Pain Assessment: 0-10 Pain Score: 5  Pain Location: R knee Pain Descriptors / Indicators: Aching Pain Intervention(s): Limited activity within patient's  tolerance;Monitored during session;Repositioned  Home Living                                          Prior Functioning/Environment              Frequency       Progress Toward Goals  OT Goals(current goals can now be found in the care plan section)  Progress towards OT goals: Goals met/education completed, patient discharged from OT  Acute Rehab OT Goals Patient Stated Goal: to go home today OT Goal Formulation: With patient Time For Goal Achievement: 03/02/16 Potential to Achieve Goals: Good ADL Goals Pt Will Perform Lower Body Dressing: with supervision;sit to/from stand (including gathering items) Pt Will Transfer to Toilet: ambulating;with set-up (3in1 over commode) Pt Will Perform Tub/Shower Transfer: Shower transfer;with caregiver independent in assisting;ambulating  Plan All goals met and education completed, patient discharged from OT services    Co-evaluation                 End of Session Equipment Utilized During Treatment: Rolling walker;Gait belt CPM Right Knee CPM Right Knee: Off   Activity Tolerance Patient tolerated treatment well   Patient Left in chair;with call bell/phone within reach   Nurse Communication Mobility status        Time: 8088-1103 OT Time Calculation (min): 17 min  Charges: OT General Charges $OT Visit: 1 Procedure OT Treatments $Self Care/Home Management : 8-22 mins  Redmond Baseman, OTR/L Pager: [phone] 02/25/2016, 10:09 AM

## 2016-02-25 NOTE — Progress Notes (Signed)
Physical Therapy Treatment Patient Details Name: Sharon Ochoa MRN: [id number] DOB: 1945/10/28 Today's Date: 02/25/2016    History of Present Illness Pt is a 70 y.o. female no s/p Rt TKA. PMH: diabetes, hypertension, osteoporosis, anxiety, arthritis.     PT Comments    Pt making steady progress with PT. Able to increase ambulation distance as well as go up/down stairs. Anticipate discharge to home with daughters assistance. PT to continue to follow.   Follow Up Recommendations  Home health PT;Supervision for mobility/OOB     Equipment Recommendations  None recommended by PT    Recommendations for Other Services       Precautions / Restrictions Precautions Precautions: Knee;Fall Restrictions Weight Bearing Restrictions: Yes RLE Weight Bearing: Weight bearing as tolerated    Mobility  Bed Mobility               General bed mobility comments: up in chair upon arrival  Transfers Overall transfer level: Needs assistance Equipment used: Rolling walker (2 wheeled) Transfers: Sit to/from Stand Sit to Stand: Supervision         General transfer comment: safe technique  Ambulation/Gait Ambulation/Gait assistance: Min guard Ambulation Distance (Feet): 400 Feet (200 ft X2) Assistive device: Rolling walker (2 wheeled) Gait Pattern/deviations: Step-through pattern;Decreased weight shift to right Gait velocity: decreased   General Gait Details: cues for even weight shift   Stairs Stairs: Yes Stairs assistance: Min assist Stair Management: No rails;Step to pattern;Forwards Number of Stairs: 2 General stair comments: pt reports feeling confident with doing stairs at home. Daughter will be able to assist.   Wheelchair Mobility    Modified Rankin (Stroke Patients Only)       Balance Overall balance assessment: Needs assistance Sitting-balance support: No upper extremity supported Sitting balance-Leahy Scale: Good     Standing balance support: During  functional activity Standing balance-Leahy Scale: Fair Standing balance comment: using rw for ambulation                    Cognition Arousal/Alertness: Awake/alert Behavior During Therapy: WFL for tasks assessed/performed Overall Cognitive Status: Within Functional Limits for tasks assessed                      Exercises Total Joint Exercises Ankle Circles/Pumps: AROM;Both;10 reps Quad Sets: Strengthening;Right;10 reps Short Arc Quad: Strengthening;Right;10 reps Heel Slides: AAROM;Right;10 reps Goniometric ROM: 91 degrees knee flexion    General Comments        Pertinent Vitals/Pain Pain Assessment: 0-10 Pain Score: 3  Pain Location: rt knee Pain Descriptors / Indicators: Aching;Burning Pain Intervention(s): Limited activity within patient's tolerance;Monitored during session;Ice applied    Home Living                      Prior Function            PT Goals (current goals can now be found in the care plan section) Acute Rehab PT Goals Patient Stated Goal: get home PT Goal Formulation: With patient Time For Goal Achievement: 03/09/16 Potential to Achieve Goals: Good Progress towards PT goals: Progressing toward goals    Frequency  7X/week    PT Plan Current plan remains appropriate    Co-evaluation             End of Session Equipment Utilized During Treatment: Gait belt Activity Tolerance: Patient tolerated treatment well Patient left: in chair;with call bell/phone within reach     Time: 0911-0947 PT Time Calculation (  min) (ACUTE ONLY): 36 min  Charges:  $Gait Training: 8-22 mins $Therapeutic Exercise: 8-22 mins                    G Codes:      Cassell Clement, PT, CSCS Pager [phone] Office [phone]  02/25/2016, 9:53 AM

## 2016-02-25 NOTE — Progress Notes (Signed)
Pt and daughter given discharge instructions and prescriptions.  All questions answered with return verbalizations. Pt is aware of [facility] for home PT and has made contact. All belongings sent home with daughter and pt. Discharged via wheelchair to car with daughter. No pain at discharge.

## 2016-02-25 NOTE — Discharge Summary (Signed)
Patient ID: Sharon Ochoa MRN: [id number] DOB/AGE: 1946-10-12 70 y.o.  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 Allergy   . Cataract   . Diabetes mellitus without complication ([place])   . Hypertension   . Osteoporosis   . Hyperthyroidism 2015    treated /w radioactive iodine   . Anxiety     pt. doesn't take any medicine, pt. reports that he uses puzzles,nature to cope   . GERD (gastroesophageal reflux disease)   . Arthritis     both wrists, knees   . Primary localized osteoarthritis of right knee   . UTI (urinary tract infection)  MSSA documented by culture   succeptible to Ancef  Treated preoperatively with Keflex 500mg  QID 02/24/2016    Surgeries: Procedure(s): RIGHT TOTAL KNEE ARTHROPLASTY on 02/23/2016   Consultants:    Discharged Condition: Improved  Hospital Course: Sharon Ochoa is an 70 y.o. female who was admitted 02/23/2016 for operative treatment ofPrimary localized osteoarthritis of right knee. Patient has severe unremitting pain that affects sleep, daily activities, and work/hobbies. After pre-op clearance the patient was taken to the operating room on 02/23/2016 and underwent  Procedure(s): RIGHT TOTAL KNEE ARTHROPLASTY.    Patient was given perioperative antibiotics: Anti-infectives    Start     Dose/Rate Route Frequency Ordered Stop   02/25/16 0000  cephALEXin (KEFLEX) 500 MG capsule     500 mg Oral 4 times daily 02/25/16 0840     02/24/16 1000  ceFAZolin (ANCEF) IVPB 2g/100 mL premix     2 g 200 mL/hr over 30 Minutes  Intravenous Every 8 hours 02/24/16 0900     02/23/16 2100  vancomycin (VANCOCIN) 1,500 mg in sodium chloride 0.9 % 500 mL IVPB     1,500 mg 250 mL/hr over 120 Minutes Intravenous  Once 02/23/16 1953 02/24/16 0000   02/23/16 1730  ceFAZolin (ANCEF) IVPB 2g/100 mL premix     2 g 200 mL/hr over 30 Minutes Intravenous Every 6 hours 02/23/16 1620 02/24/16 0529   02/23/16 0930  vancomycin (VANCOCIN) 1,500 mg in sodium chloride 0.9 % 500 mL IVPB     1,500 mg 250 mL/hr over 120 Minutes Intravenous To Short Stay 02/22/16 1430 02/23/16 1049       Patient was given sequential compression devices, early ambulation, and chemoprophylaxis to prevent DVT.  Patient benefited maximally from hospital stay and there were no complications.    Recent vital signs: Patient Vitals for the past 24 hrs:  BP Temp Temp src Pulse Resp SpO2  02/25/16 0425 (!) 155/57 mmHg 99.1 F (37.3 C) Oral 90 18 97 %  02/24/16 1900 (!) 170/69 mmHg 99.1 F (37.3 C) Oral 93 18 97 %  02/24/16 1700 (!) 153/63 mmHg 98.9 F (37.2 C) Oral 85 18 94 %     Recent laboratory studies:  Recent Labs  02/24/16 0522 02/25/16 0337  WBC 15.6* 19.8*  HGB 10.9* 10.5*  HCT 32.1* 31.7*  PLT 178 203  NA 132* 134*  K 4.3 4.9  CL 101 102  CO2  22 25  BUN 15 16  CREATININE 0.77 0.82  GLUCOSE 241* 281*  CALCIUM 8.7* 8.8*     Discharge Medications:     Medication List    STOP taking these medications        CRANBERRY CONCENTRATE PO     Fish Oil 1000 MG Cpdr     meloxicam 15 MG tablet  Commonly known as:  MOBIC      TAKE these medications        acetaminophen 325 MG tablet  Commonly known as:  TYLENOL  Take 650 mg by mouth every 6 (six) hours as needed (for pain.).     apixaban 2.5 MG Tabs tablet  Commonly known as:  ELIQUIS  1 tablet every 12 hrs for 30 days  To prevent blood clots.  When finished start Aspirin 81 mg a day for 1 month     cephALEXin 500 MG capsule  Commonly known as:  KEFLEX  Take 1 capsule (500 mg  total) by mouth 4 (four) times daily.     cholecalciferol 1000 units tablet  Commonly known as:  VITAMIN D  Take 2,000 Units by mouth daily.     diazepam 2 MG tablet  Commonly known as:  VALIUM  1 po q 8 hrs prn muscle spasm     docusate sodium 100 MG capsule  Commonly known as:  COLACE  1 tab 2 times a day while on narcotics.  STOOL SOFTENER     glucose blood test strip  1 each by Other route 3 (three) times daily.     lisinopril 20 MG tablet  Commonly known as:  PRINIVIL,ZESTRIL  Take 20 mg by mouth daily.     NOVOLOG MIX 70/30 FLEXPEN (70-30) 100 UNIT/ML FlexPen  Generic drug:  insulin aspart protamine - aspart  Inject 12-22 Units into the skin 2 (two) times daily with a meal. 22 units in the morning, 12 units in the evening     omeprazole 20 MG capsule  Commonly known as:  PRILOSEC  Take 20 mg by mouth daily.     ondansetron 4 MG tablet  Commonly known as:  ZOFRAN  1 po q 6-8 hrs prn nausea     oxyCODONE 5 MG immediate release tablet  Commonly known as:  Oxy IR/ROXICODONE  1-2 tablets every 4-6 hrs as needed for pain     polyethylene glycol packet  Commonly known as:  MIRALAX / GLYCOLAX  17grams in 16 oz of water twice a day until bowel movement.  LAXITIVE.  Restart if two days since last bowel movement     saccharomyces boulardii 250 MG capsule  Commonly known as:  FLORASTOR  Take 250 mg by mouth 2 (two) times daily as needed (when taking antibiotics).     vitamin C 500 MG tablet  Commonly known as:  ASCORBIC ACID  Take 500 mg by mouth daily as needed (for cold symptoms.).        Diagnostic Studies: Dg Neck Soft Tissue  02/21/2016  CLINICAL DATA:  Choked on food tonight and feels like there is something stuck in throat. EXAM: NECK SOFT TISSUES - 1+ VIEW COMPARISON:  None. FINDINGS: There is no evidence of retropharyngeal soft tissue swelling or epiglottic enlargement. The cervical airway is unremarkable and no radio-opaque foreign body identified. IMPRESSION:  Negative. Electronically Signed   By: Monte Fantasia M.D.   On: 02/21/2016 23:55   Dg Chest 2 View  02/21/2016  CLINICAL DATA:  Choked on food.  Sensation of object stuck in throat. Vomiting. Initial encounter. EXAM: CHEST  2 VIEW COMPARISON:  Chest radiograph performed 06/24/2015 FINDINGS: The lungs are well-aerated and clear. There is no evidence of focal opacification, pleural effusion or pneumothorax. The heart is normal in size; the mediastinal contour is within normal limits. No acute osseous abnormalities are seen. No radiopaque foreign bodies are identified. IMPRESSION: No acute cardiopulmonary process seen. Electronically Signed   By: Garald Balding M.D.   On: 02/21/2016 23:58    Disposition: 01-Home or Self Care      Discharge Instructions    CPM    Complete by:  As directed   Continuous passive motion machine (CPM):      Use the CPM from 0 to 90 for 6 hours per day.       You may break it up into 2 or 3 sessions per day.      Use CPM for 2 weeks or until you are told to stop.     Call MD / Call 911    Complete by:  As directed   If you experience chest pain or shortness of breath, CALL 911 and be transported to the hospital emergency room.  If you develope a fever above 101 F, pus (white drainage) or increased drainage or redness at the wound, or calf pain, call your surgeon's office.     Change dressing    Complete by:  As directed   Change the gauze dressing daily with sterile 4 x 4 inch gauze and apply TED hose.  DO NOT REMOVE BANDAGE OVER SURGICAL INCISION.  [place] WHOLE LEG INCLUDING OVER THE WATERPROOF BANDAGE WITH SOAP AND WATER EVERY DAY.     Constipation Prevention    Complete by:  As directed   Drink plenty of fluids.  Prune juice may be helpful.  You may use a stool softener, such as Colace (over the counter) 100 mg twice a day.  Use MiraLax (over the counter) for constipation as needed.     Diet - low sodium heart healthy    Complete by:  As directed      Discharge  instructions    Complete by:  As directed   INSTRUCTIONS AFTER JOINT REPLACEMENT   Remove items at home which could result in a fall. This includes throw rugs or furniture in walking pathways ICE to the affected joint every three hours while awake for 30 minutes at a time, for at least the first 3-5 days, and then as needed for pain and swelling.  Continue to use ice for pain and swelling. You may notice swelling that will progress down to the foot and ankle.  This is normal after surgery.  Elevate your leg when you are not up walking on it.   Continue to use the breathing machine you got in the hospital (incentive spirometer) which will help keep your temperature down.  It is common for your temperature to cycle up and down following surgery, especially at night when you are not up moving around and exerting yourself.  The breathing machine keeps your lungs expanded and your temperature down.   DIET:  As you were doing prior to hospitalization, we recommend a well-balanced diet.  DRESSING / WOUND CARE / SHOWERING  Keep the surgical dressing until follow up.  The dressing is water proof, so you can shower without any extra covering.  IF THE DRESSING FALLS OFF or the wound gets wet inside, change the dressing with sterile gauze.  Please use good hand washing techniques before changing the dressing.  Do not use any lotions or creams on the incision until instructed by your surgeon.    ACTIVITY  Increase activity slowly as tolerated, but follow the weight bearing instructions below.   No driving for 6 weeks or until further direction given by your physician.  You cannot drive while taking narcotics.  No lifting or carrying greater than 10 lbs. until further directed by your surgeon. Avoid periods of inactivity such as sitting longer than an hour when not asleep. This helps prevent blood clots.  You may return to work once you are authorized by your doctor.     WEIGHT BEARING   Weight bearing  as tolerated with assist device (walker, cane, etc) as directed, use it as long as suggested by your surgeon or therapist, typically at least 2-3 weeks.   EXERCISES  Results after joint replacement surgery are often greatly improved when you follow the exercise, range of motion and muscle strengthening exercises prescribed by your doctor. Safety measures are also important to protect the joint from further injury. Any time any of these exercises cause you to have increased pain or swelling, decrease what you are doing until you are comfortable again and then slowly increase them. If you have problems or questions, call your caregiver or physical therapist for advice.   Rehabilitation is important following a joint replacement. After just a few days of immobilization, the muscles of the leg can become weakened and shrink (atrophy).  These exercises are designed to build up the tone and strength of the thigh and leg muscles and to improve motion. Often times heat used for twenty to thirty minutes before working out will loosen up your tissues and help with improving the range of motion but do not use heat for the first two weeks following surgery (sometimes heat can increase post-operative swelling).   These exercises can be done on a training (exercise) mat, on the floor, on a table or on a bed. Use whatever works the best and is most comfortable for you.    Use music or television while you are exercising so that the exercises are a pleasant break in your day. This will make your life better with the exercises acting as a break in your routine that you can look forward to.   Perform all exercises about fifteen times, three times per day or as directed.  You should exercise both the operative leg and the other leg as well.   Exercises include:  Quad Sets - Tighten up the muscle on the front of the thigh (Quad) and hold for 5-10 seconds.   Straight Leg Raises - With your knee straight (if you were given  a brace, keep it on), lift the leg to 60 degrees, hold for 3 seconds, and slowly lower the leg.  Perform this exercise against resistance later as your leg gets stronger.  Leg Slides: Lying on your back, slowly slide your foot toward your buttocks, bending your knee up off the floor (only go as far as is comfortable). Then slowly slide your foot back down until your leg is flat on the floor again.  Angel Wings: Lying on your back spread your legs to the side as far apart as you can without causing discomfort.  Hamstring Strength:  Lying on your back, push your heel against the floor with your leg straight by tightening up the muscles of your buttocks.  Repeat, but this time bend  your knee to a comfortable angle, and push your heel against the floor.  You may put a pillow under the heel to make it more comfortable if necessary.   A rehabilitation program following joint replacement surgery can speed recovery and prevent re-injury in the future due to weakened muscles. Contact your doctor or a physical therapist for more information on knee rehabilitation.    CONSTIPATION  Constipation is defined medically as fewer than three stools per week and severe constipation as less than one stool per week.  Even if you have a regular bowel pattern at home, your normal regimen is likely to be disrupted due to multiple reasons following surgery.  Combination of anesthesia, postoperative narcotics, change in appetite and fluid intake all can affect your bowels.   YOU MUST use at least one of the following options; they are listed in order of increasing strength to get the job done.  They are all available over the counter, and you may need to use some, POSSIBLY even all of these options:    Drink plenty of fluids (prune juice may be helpful) and high fiber foods Colace 100 mg by mouth twice a day  Senokot for constipation as directed and as needed Dulcolax (bisacodyl), take with full glass of water  Miralax  (polyethylene glycol) once or twice a day as needed.  If you have tried all these things and are unable to have a bowel movement in the first 3-4 days after surgery call either your surgeon or your primary doctor.    If you experience loose stools or diarrhea, hold the medications until you stool forms back up.  If your symptoms do not get better within 1 week or if they get worse, check with your doctor.  If you experience "the worst abdominal pain ever" or develop nausea or vomiting, please contact the office immediately for further recommendations for treatment.   ITCHING:  If you experience itching with your medications, try taking only a single pain pill, or even half a pain pill at a time.  You can also use Benadryl over the counter for itching or also to help with sleep.   TED HOSE STOCKINGS:  Use stockings on both legs until for at least 2 weeks or as directed by physician office. They may be removed at night for sleeping.  MEDICATIONS:  See your medication summary on the "After Visit Summary" that nursing will review with you.  You may have some home medications which will be placed on hold until you complete the course of blood thinner medication.  It is important for you to complete the blood thinner medication as prescribed.  PRECAUTIONS:  If you experience chest pain or shortness of breath - call 911 immediately for transfer to the hospital emergency department.   If you develop a fever greater that 101 F, purulent drainage from wound, increased redness or drainage from wound, foul odor from the wound/dressing, or calf pain - CONTACT YOUR SURGEON.                                                   FOLLOW-UP APPOINTMENTS:  If you do not already have a post-op appointment, please call the office for an appointment to be seen by your surgeon.  Guidelines for how soon to be seen are listed in your "After Visit Summary",  but are typically between 1-4 weeks after surgery.  OTHER  INSTRUCTIONS:   Knee Replacement:  Do not place pillow under knee, focus on keeping the knee straight while resting. CPM instructions: 0-90 degrees, 2 hours in the morning, 2 hours in the afternoon, and 2 hours in the evening. Place foam block, curve side up under heel at all times except when in CPM or when walking.  DO NOT modify, tear, cut, or change the foam block in any way.  MAKE SURE YOU:  Understand these instructions.  Get help right away if you are not doing well or get worse.    Thank you for letting us be a part of your medical care team.  It is a privilege we respect greatly.  We hope these instructions will help you stay on track for a fast and full recovery!     Do not put a pillow under the knee. Place it under the heel.    Complete by:  As directed   Place gray foam block, curve side up under heel at all times except when in CPM or when walking.  DO NOT modify, tear, cut, or change in any way the gray foam block.     Increase activity slowly as tolerated    Complete by:  As directed      Patient may shower    Complete by:  As directed   Aquacel dressing is water proof    Wash over it and the whole leg with soap and water at the end of your shower     TED hose    Complete by:  As directed   Use stockings (TED hose) for 2 weeks on both leg(s).  You may remove them at night for sleeping.           Follow-up Information    Follow up with [facility].   Why:  Someone from [facility] will contact you to arrange start date and time for therapy.   Contact information:   [address] [place] [address] [phone]       Follow up with Lorn Junes, MD On 03/08/2016.   Specialty:  Orthopedic Surgery   Why:  appt time  3:30pm   Contact information:   [address] [place] [address] [phone]        Signed: Linda Hedges 02/25/2016, 8:48 AM

## 2016-02-26 DIAGNOSIS — Z471 Aftercare following joint replacement surgery: Secondary | ICD-10-CM | POA: Diagnosis not present

## 2016-02-26 DIAGNOSIS — E119 Type 2 diabetes mellitus without complications: Secondary | ICD-10-CM | POA: Diagnosis not present

## 2016-02-26 DIAGNOSIS — Z96651 Presence of right artificial knee joint: Secondary | ICD-10-CM | POA: Diagnosis not present

## 2016-02-26 DIAGNOSIS — I10 Essential (primary) hypertension: Secondary | ICD-10-CM | POA: Diagnosis not present

## 2016-02-26 DIAGNOSIS — M150 Primary generalized (osteo)arthritis: Secondary | ICD-10-CM | POA: Diagnosis not present

## 2016-02-26 DIAGNOSIS — F419 Anxiety disorder, unspecified: Secondary | ICD-10-CM | POA: Diagnosis not present

## 2016-02-27 DIAGNOSIS — E119 Type 2 diabetes mellitus without complications: Secondary | ICD-10-CM | POA: Diagnosis not present

## 2016-02-27 DIAGNOSIS — Z471 Aftercare following joint replacement surgery: Secondary | ICD-10-CM | POA: Diagnosis not present

## 2016-02-27 DIAGNOSIS — I10 Essential (primary) hypertension: Secondary | ICD-10-CM | POA: Diagnosis not present

## 2016-02-27 DIAGNOSIS — M150 Primary generalized (osteo)arthritis: Secondary | ICD-10-CM | POA: Diagnosis not present

## 2016-02-27 DIAGNOSIS — F419 Anxiety disorder, unspecified: Secondary | ICD-10-CM | POA: Diagnosis not present

## 2016-02-27 DIAGNOSIS — Z96651 Presence of right artificial knee joint: Secondary | ICD-10-CM | POA: Diagnosis not present

## 2016-03-01 DIAGNOSIS — F419 Anxiety disorder, unspecified: Secondary | ICD-10-CM | POA: Diagnosis not present

## 2016-03-01 DIAGNOSIS — E119 Type 2 diabetes mellitus without complications: Secondary | ICD-10-CM | POA: Diagnosis not present

## 2016-03-01 DIAGNOSIS — Z96651 Presence of right artificial knee joint: Secondary | ICD-10-CM | POA: Diagnosis not present

## 2016-03-01 DIAGNOSIS — Z471 Aftercare following joint replacement surgery: Secondary | ICD-10-CM | POA: Diagnosis not present

## 2016-03-01 DIAGNOSIS — I10 Essential (primary) hypertension: Secondary | ICD-10-CM | POA: Diagnosis not present

## 2016-03-01 DIAGNOSIS — M150 Primary generalized (osteo)arthritis: Secondary | ICD-10-CM | POA: Diagnosis not present

## 2016-03-02 DIAGNOSIS — M1711 Unilateral primary osteoarthritis, right knee: Secondary | ICD-10-CM | POA: Diagnosis not present

## 2016-03-03 DIAGNOSIS — M150 Primary generalized (osteo)arthritis: Secondary | ICD-10-CM | POA: Diagnosis not present

## 2016-03-03 DIAGNOSIS — Z96651 Presence of right artificial knee joint: Secondary | ICD-10-CM | POA: Diagnosis not present

## 2016-03-03 DIAGNOSIS — Z471 Aftercare following joint replacement surgery: Secondary | ICD-10-CM | POA: Diagnosis not present

## 2016-03-03 DIAGNOSIS — I10 Essential (primary) hypertension: Secondary | ICD-10-CM | POA: Diagnosis not present

## 2016-03-03 DIAGNOSIS — F419 Anxiety disorder, unspecified: Secondary | ICD-10-CM | POA: Diagnosis not present

## 2016-03-03 DIAGNOSIS — E119 Type 2 diabetes mellitus without complications: Secondary | ICD-10-CM | POA: Diagnosis not present

## 2016-03-04 DIAGNOSIS — Z96651 Presence of right artificial knee joint: Secondary | ICD-10-CM | POA: Diagnosis not present

## 2016-03-04 DIAGNOSIS — I10 Essential (primary) hypertension: Secondary | ICD-10-CM | POA: Diagnosis not present

## 2016-03-04 DIAGNOSIS — F419 Anxiety disorder, unspecified: Secondary | ICD-10-CM | POA: Diagnosis not present

## 2016-03-04 DIAGNOSIS — E119 Type 2 diabetes mellitus without complications: Secondary | ICD-10-CM | POA: Diagnosis not present

## 2016-03-04 DIAGNOSIS — Z471 Aftercare following joint replacement surgery: Secondary | ICD-10-CM | POA: Diagnosis not present

## 2016-03-04 DIAGNOSIS — M150 Primary generalized (osteo)arthritis: Secondary | ICD-10-CM | POA: Diagnosis not present

## 2016-03-05 DIAGNOSIS — M545 Low back pain: Secondary | ICD-10-CM | POA: Diagnosis not present

## 2016-03-05 DIAGNOSIS — M1711 Unilateral primary osteoarthritis, right knee: Secondary | ICD-10-CM | POA: Diagnosis not present

## 2016-03-05 DIAGNOSIS — M25551 Pain in right hip: Secondary | ICD-10-CM | POA: Diagnosis not present

## 2016-03-08 DIAGNOSIS — M1711 Unilateral primary osteoarthritis, right knee: Secondary | ICD-10-CM | POA: Diagnosis not present

## 2016-03-08 DIAGNOSIS — M25661 Stiffness of right knee, not elsewhere classified: Secondary | ICD-10-CM | POA: Diagnosis not present

## 2016-03-08 DIAGNOSIS — R531 Weakness: Secondary | ICD-10-CM | POA: Diagnosis not present

## 2016-03-08 DIAGNOSIS — Z96651 Presence of right artificial knee joint: Secondary | ICD-10-CM | POA: Diagnosis not present

## 2016-03-08 DIAGNOSIS — M25561 Pain in right knee: Secondary | ICD-10-CM | POA: Diagnosis not present

## 2016-03-10 DIAGNOSIS — E0590 Thyrotoxicosis, unspecified without thyrotoxic crisis or storm: Secondary | ICD-10-CM | POA: Diagnosis not present

## 2016-03-10 DIAGNOSIS — M25561 Pain in right knee: Secondary | ICD-10-CM | POA: Diagnosis not present

## 2016-03-10 DIAGNOSIS — E1165 Type 2 diabetes mellitus with hyperglycemia: Secondary | ICD-10-CM | POA: Diagnosis not present

## 2016-03-10 DIAGNOSIS — M1711 Unilateral primary osteoarthritis, right knee: Secondary | ICD-10-CM | POA: Diagnosis not present

## 2016-03-10 DIAGNOSIS — M25661 Stiffness of right knee, not elsewhere classified: Secondary | ICD-10-CM | POA: Diagnosis not present

## 2016-03-10 DIAGNOSIS — R531 Weakness: Secondary | ICD-10-CM | POA: Diagnosis not present

## 2016-03-12 DIAGNOSIS — M25561 Pain in right knee: Secondary | ICD-10-CM | POA: Diagnosis not present

## 2016-03-12 DIAGNOSIS — M25661 Stiffness of right knee, not elsewhere classified: Secondary | ICD-10-CM | POA: Diagnosis not present

## 2016-03-12 DIAGNOSIS — M1711 Unilateral primary osteoarthritis, right knee: Secondary | ICD-10-CM | POA: Diagnosis not present

## 2016-03-12 DIAGNOSIS — R561 Post traumatic seizures: Secondary | ICD-10-CM | POA: Diagnosis not present

## 2016-03-15 DIAGNOSIS — R531 Weakness: Secondary | ICD-10-CM | POA: Diagnosis not present

## 2016-03-15 DIAGNOSIS — M25561 Pain in right knee: Secondary | ICD-10-CM | POA: Diagnosis not present

## 2016-03-15 DIAGNOSIS — M25661 Stiffness of right knee, not elsewhere classified: Secondary | ICD-10-CM | POA: Diagnosis not present

## 2016-03-15 DIAGNOSIS — M1711 Unilateral primary osteoarthritis, right knee: Secondary | ICD-10-CM | POA: Diagnosis not present

## 2016-03-17 DIAGNOSIS — E1165 Type 2 diabetes mellitus with hyperglycemia: Secondary | ICD-10-CM | POA: Diagnosis not present

## 2016-03-17 DIAGNOSIS — R531 Weakness: Secondary | ICD-10-CM | POA: Diagnosis not present

## 2016-03-17 DIAGNOSIS — M1711 Unilateral primary osteoarthritis, right knee: Secondary | ICD-10-CM | POA: Diagnosis not present

## 2016-03-17 DIAGNOSIS — M25561 Pain in right knee: Secondary | ICD-10-CM | POA: Diagnosis not present

## 2016-03-17 DIAGNOSIS — M25661 Stiffness of right knee, not elsewhere classified: Secondary | ICD-10-CM | POA: Diagnosis not present

## 2016-03-23 DIAGNOSIS — M25561 Pain in right knee: Secondary | ICD-10-CM | POA: Diagnosis not present

## 2016-03-23 DIAGNOSIS — M1711 Unilateral primary osteoarthritis, right knee: Secondary | ICD-10-CM | POA: Diagnosis not present

## 2016-03-23 DIAGNOSIS — M25661 Stiffness of right knee, not elsewhere classified: Secondary | ICD-10-CM | POA: Diagnosis not present

## 2016-03-23 DIAGNOSIS — R531 Weakness: Secondary | ICD-10-CM | POA: Diagnosis not present

## 2016-03-25 DIAGNOSIS — M25661 Stiffness of right knee, not elsewhere classified: Secondary | ICD-10-CM | POA: Diagnosis not present

## 2016-03-25 DIAGNOSIS — M25561 Pain in right knee: Secondary | ICD-10-CM | POA: Diagnosis not present

## 2016-03-25 DIAGNOSIS — M1711 Unilateral primary osteoarthritis, right knee: Secondary | ICD-10-CM | POA: Diagnosis not present

## 2016-03-25 DIAGNOSIS — R531 Weakness: Secondary | ICD-10-CM | POA: Diagnosis not present

## 2016-03-29 DIAGNOSIS — M1711 Unilateral primary osteoarthritis, right knee: Secondary | ICD-10-CM | POA: Diagnosis not present

## 2016-03-29 DIAGNOSIS — M25561 Pain in right knee: Secondary | ICD-10-CM | POA: Diagnosis not present

## 2016-03-29 DIAGNOSIS — M25661 Stiffness of right knee, not elsewhere classified: Secondary | ICD-10-CM | POA: Diagnosis not present

## 2016-03-29 DIAGNOSIS — R531 Weakness: Secondary | ICD-10-CM | POA: Diagnosis not present

## 2016-04-01 DIAGNOSIS — R531 Weakness: Secondary | ICD-10-CM | POA: Diagnosis not present

## 2016-04-01 DIAGNOSIS — M25561 Pain in right knee: Secondary | ICD-10-CM | POA: Diagnosis not present

## 2016-04-01 DIAGNOSIS — M1711 Unilateral primary osteoarthritis, right knee: Secondary | ICD-10-CM | POA: Diagnosis not present

## 2016-04-01 DIAGNOSIS — M25661 Stiffness of right knee, not elsewhere classified: Secondary | ICD-10-CM | POA: Diagnosis not present

## 2016-04-04 DIAGNOSIS — R300 Dysuria: Secondary | ICD-10-CM | POA: Diagnosis not present

## 2016-04-05 DIAGNOSIS — R531 Weakness: Secondary | ICD-10-CM | POA: Diagnosis not present

## 2016-04-05 DIAGNOSIS — M25561 Pain in right knee: Secondary | ICD-10-CM | POA: Diagnosis not present

## 2016-04-05 DIAGNOSIS — M1711 Unilateral primary osteoarthritis, right knee: Secondary | ICD-10-CM | POA: Diagnosis not present

## 2016-04-05 DIAGNOSIS — M25661 Stiffness of right knee, not elsewhere classified: Secondary | ICD-10-CM | POA: Diagnosis not present

## 2016-04-05 DIAGNOSIS — B3749 Other urogenital candidiasis: Secondary | ICD-10-CM | POA: Diagnosis not present

## 2016-04-06 DIAGNOSIS — Z96651 Presence of right artificial knee joint: Secondary | ICD-10-CM | POA: Diagnosis not present

## 2016-04-08 DIAGNOSIS — M25661 Stiffness of right knee, not elsewhere classified: Secondary | ICD-10-CM | POA: Diagnosis not present

## 2016-04-08 DIAGNOSIS — M25561 Pain in right knee: Secondary | ICD-10-CM | POA: Diagnosis not present

## 2016-04-08 DIAGNOSIS — R531 Weakness: Secondary | ICD-10-CM | POA: Diagnosis not present

## 2016-04-08 DIAGNOSIS — M1711 Unilateral primary osteoarthritis, right knee: Secondary | ICD-10-CM | POA: Diagnosis not present

## 2016-04-12 DIAGNOSIS — M25561 Pain in right knee: Secondary | ICD-10-CM | POA: Diagnosis not present

## 2016-04-12 DIAGNOSIS — R531 Weakness: Secondary | ICD-10-CM | POA: Diagnosis not present

## 2016-04-12 DIAGNOSIS — M1711 Unilateral primary osteoarthritis, right knee: Secondary | ICD-10-CM | POA: Diagnosis not present

## 2016-04-12 DIAGNOSIS — M25661 Stiffness of right knee, not elsewhere classified: Secondary | ICD-10-CM | POA: Diagnosis not present

## 2016-04-15 DIAGNOSIS — M1711 Unilateral primary osteoarthritis, right knee: Secondary | ICD-10-CM | POA: Diagnosis not present

## 2016-04-15 DIAGNOSIS — M25661 Stiffness of right knee, not elsewhere classified: Secondary | ICD-10-CM | POA: Diagnosis not present

## 2016-04-15 DIAGNOSIS — R531 Weakness: Secondary | ICD-10-CM | POA: Diagnosis not present

## 2016-04-15 DIAGNOSIS — M25561 Pain in right knee: Secondary | ICD-10-CM | POA: Diagnosis not present

## 2016-04-19 DIAGNOSIS — M25661 Stiffness of right knee, not elsewhere classified: Secondary | ICD-10-CM | POA: Diagnosis not present

## 2016-04-19 DIAGNOSIS — M1711 Unilateral primary osteoarthritis, right knee: Secondary | ICD-10-CM | POA: Diagnosis not present

## 2016-04-19 DIAGNOSIS — R531 Weakness: Secondary | ICD-10-CM | POA: Diagnosis not present

## 2016-04-19 DIAGNOSIS — M25561 Pain in right knee: Secondary | ICD-10-CM | POA: Diagnosis not present

## 2016-04-22 DIAGNOSIS — M545 Low back pain: Secondary | ICD-10-CM | POA: Diagnosis not present

## 2016-04-22 DIAGNOSIS — M5416 Radiculopathy, lumbar region: Secondary | ICD-10-CM | POA: Diagnosis not present

## 2016-04-26 DIAGNOSIS — M5416 Radiculopathy, lumbar region: Secondary | ICD-10-CM | POA: Diagnosis not present

## 2016-04-26 DIAGNOSIS — M545 Low back pain: Secondary | ICD-10-CM | POA: Diagnosis not present

## 2016-04-29 DIAGNOSIS — M5416 Radiculopathy, lumbar region: Secondary | ICD-10-CM | POA: Diagnosis not present

## 2016-04-29 DIAGNOSIS — M545 Low back pain: Secondary | ICD-10-CM | POA: Diagnosis not present

## 2016-04-30 DIAGNOSIS — E1165 Type 2 diabetes mellitus with hyperglycemia: Secondary | ICD-10-CM | POA: Diagnosis not present

## 2016-05-03 DIAGNOSIS — M545 Low back pain: Secondary | ICD-10-CM | POA: Diagnosis not present

## 2016-05-03 DIAGNOSIS — M5416 Radiculopathy, lumbar region: Secondary | ICD-10-CM | POA: Diagnosis not present

## 2016-05-04 DIAGNOSIS — M1711 Unilateral primary osteoarthritis, right knee: Secondary | ICD-10-CM | POA: Diagnosis not present

## 2016-05-06 DIAGNOSIS — M5416 Radiculopathy, lumbar region: Secondary | ICD-10-CM | POA: Diagnosis not present

## 2016-05-06 DIAGNOSIS — M545 Low back pain: Secondary | ICD-10-CM | POA: Diagnosis not present

## 2016-05-10 DIAGNOSIS — M545 Low back pain: Secondary | ICD-10-CM | POA: Diagnosis not present

## 2016-05-10 DIAGNOSIS — M5416 Radiculopathy, lumbar region: Secondary | ICD-10-CM | POA: Diagnosis not present

## 2016-05-13 DIAGNOSIS — M545 Low back pain: Secondary | ICD-10-CM | POA: Diagnosis not present

## 2016-05-13 DIAGNOSIS — M5416 Radiculopathy, lumbar region: Secondary | ICD-10-CM | POA: Diagnosis not present

## 2016-05-20 DIAGNOSIS — M5416 Radiculopathy, lumbar region: Secondary | ICD-10-CM | POA: Diagnosis not present

## 2016-05-20 DIAGNOSIS — M545 Low back pain: Secondary | ICD-10-CM | POA: Diagnosis not present

## 2016-05-24 DIAGNOSIS — M545 Low back pain: Secondary | ICD-10-CM | POA: Diagnosis not present

## 2016-05-24 DIAGNOSIS — M5416 Radiculopathy, lumbar region: Secondary | ICD-10-CM | POA: Diagnosis not present

## 2016-05-31 ENCOUNTER — Other Ambulatory Visit: Payer: Self-pay | Admitting: Family Medicine

## 2016-05-31 DIAGNOSIS — K219 Gastro-esophageal reflux disease without esophagitis: Secondary | ICD-10-CM | POA: Diagnosis not present

## 2016-05-31 DIAGNOSIS — Z1239 Encounter for other screening for malignant neoplasm of breast: Secondary | ICD-10-CM | POA: Diagnosis not present

## 2016-05-31 DIAGNOSIS — F329 Major depressive disorder, single episode, unspecified: Secondary | ICD-10-CM | POA: Diagnosis not present

## 2016-05-31 DIAGNOSIS — Z1231 Encounter for screening mammogram for malignant neoplasm of breast: Secondary | ICD-10-CM

## 2016-05-31 DIAGNOSIS — E7800 Pure hypercholesterolemia, unspecified: Secondary | ICD-10-CM | POA: Diagnosis not present

## 2016-05-31 DIAGNOSIS — I10 Essential (primary) hypertension: Secondary | ICD-10-CM | POA: Diagnosis not present

## 2016-06-08 ENCOUNTER — Ambulatory Visit: Payer: Medicare Other

## 2016-06-09 DIAGNOSIS — R300 Dysuria: Secondary | ICD-10-CM | POA: Diagnosis not present

## 2016-06-10 DIAGNOSIS — E1165 Type 2 diabetes mellitus with hyperglycemia: Secondary | ICD-10-CM | POA: Diagnosis not present

## 2016-06-11 ENCOUNTER — Ambulatory Visit
Admission: RE | Admit: 2016-06-11 | Discharge: 2016-06-11 | Disposition: A | Discharge status: Home / Self Care | Payer: Medicare Other | Source: Ambulatory Visit | Attending: Family Medicine | Admitting: Family Medicine

## 2016-06-11 DIAGNOSIS — Z1231 Encounter for screening mammogram for malignant neoplasm of breast: Secondary | ICD-10-CM | POA: Diagnosis not present

## 2016-06-16 DIAGNOSIS — E0590 Thyrotoxicosis, unspecified without thyrotoxic crisis or storm: Secondary | ICD-10-CM | POA: Diagnosis not present

## 2016-06-16 DIAGNOSIS — E1165 Type 2 diabetes mellitus with hyperglycemia: Secondary | ICD-10-CM | POA: Diagnosis not present

## 2016-06-16 DIAGNOSIS — I10 Essential (primary) hypertension: Secondary | ICD-10-CM | POA: Diagnosis not present

## 2016-07-12 DIAGNOSIS — N3020 Other chronic cystitis without hematuria: Secondary | ICD-10-CM | POA: Diagnosis not present

## 2016-07-19 DIAGNOSIS — E0590 Thyrotoxicosis, unspecified without thyrotoxic crisis or storm: Secondary | ICD-10-CM | POA: Diagnosis not present

## 2016-07-19 DIAGNOSIS — N3090 Cystitis, unspecified without hematuria: Secondary | ICD-10-CM | POA: Diagnosis not present

## 2016-08-03 ENCOUNTER — Other Ambulatory Visit: Payer: Self-pay | Admitting: Orthopedic Surgery

## 2016-08-03 DIAGNOSIS — M5416 Radiculopathy, lumbar region: Secondary | ICD-10-CM | POA: Diagnosis not present

## 2016-08-03 DIAGNOSIS — G8929 Other chronic pain: Secondary | ICD-10-CM

## 2016-08-03 DIAGNOSIS — M25551 Pain in right hip: Secondary | ICD-10-CM

## 2016-08-03 DIAGNOSIS — M545 Low back pain, unspecified: Secondary | ICD-10-CM

## 2016-08-04 DIAGNOSIS — N3020 Other chronic cystitis without hematuria: Secondary | ICD-10-CM | POA: Diagnosis not present

## 2016-08-04 DIAGNOSIS — N952 Postmenopausal atrophic vaginitis: Secondary | ICD-10-CM | POA: Diagnosis not present

## 2016-08-11 DIAGNOSIS — Z23 Encounter for immunization: Secondary | ICD-10-CM | POA: Diagnosis not present

## 2016-08-15 ENCOUNTER — Other Ambulatory Visit: Payer: Medicare Other

## 2016-08-15 ENCOUNTER — Ambulatory Visit
Admission: RE | Admit: 2016-08-15 | Discharge: 2016-08-15 | Disposition: A | Discharge status: Home / Self Care | Payer: Medicare Other | Source: Ambulatory Visit | Attending: Orthopedic Surgery | Admitting: Orthopedic Surgery

## 2016-08-15 DIAGNOSIS — M545 Low back pain, unspecified: Secondary | ICD-10-CM

## 2016-08-15 DIAGNOSIS — M25551 Pain in right hip: Secondary | ICD-10-CM

## 2016-08-15 DIAGNOSIS — G8929 Other chronic pain: Secondary | ICD-10-CM

## 2016-08-15 DIAGNOSIS — M5126 Other intervertebral disc displacement, lumbar region: Secondary | ICD-10-CM | POA: Diagnosis not present

## 2016-08-20 DIAGNOSIS — H02832 Dermatochalasis of right lower eyelid: Secondary | ICD-10-CM | POA: Diagnosis not present

## 2016-08-20 DIAGNOSIS — Z882 Allergy status to sulfonamides status: Secondary | ICD-10-CM | POA: Diagnosis not present

## 2016-08-20 DIAGNOSIS — E7800 Pure hypercholesterolemia, unspecified: Secondary | ICD-10-CM | POA: Diagnosis not present

## 2016-08-20 DIAGNOSIS — Z888 Allergy status to other drugs, medicaments and biological substances status: Secondary | ICD-10-CM | POA: Diagnosis not present

## 2016-08-20 DIAGNOSIS — H52203 Unspecified astigmatism, bilateral: Secondary | ICD-10-CM | POA: Diagnosis not present

## 2016-08-20 DIAGNOSIS — Z9841 Cataract extraction status, right eye: Secondary | ICD-10-CM | POA: Diagnosis not present

## 2016-08-20 DIAGNOSIS — Z79899 Other long term (current) drug therapy: Secondary | ICD-10-CM | POA: Diagnosis not present

## 2016-08-20 DIAGNOSIS — H11823 Conjunctivochalasis, bilateral: Secondary | ICD-10-CM | POA: Diagnosis not present

## 2016-08-20 DIAGNOSIS — Z9842 Cataract extraction status, left eye: Secondary | ICD-10-CM | POA: Diagnosis not present

## 2016-08-20 DIAGNOSIS — Z881 Allergy status to other antibiotic agents status: Secondary | ICD-10-CM | POA: Diagnosis not present

## 2016-08-20 DIAGNOSIS — H02835 Dermatochalasis of left lower eyelid: Secondary | ICD-10-CM | POA: Diagnosis not present

## 2016-08-20 DIAGNOSIS — H524 Presbyopia: Secondary | ICD-10-CM | POA: Diagnosis not present

## 2016-08-20 DIAGNOSIS — H5213 Myopia, bilateral: Secondary | ICD-10-CM | POA: Diagnosis not present

## 2016-08-20 DIAGNOSIS — H02833 Dermatochalasis of right eye, unspecified eyelid: Secondary | ICD-10-CM | POA: Diagnosis not present

## 2016-08-20 DIAGNOSIS — I10 Essential (primary) hypertension: Secondary | ICD-10-CM | POA: Diagnosis not present

## 2016-08-20 DIAGNOSIS — H02836 Dermatochalasis of left eye, unspecified eyelid: Secondary | ICD-10-CM | POA: Diagnosis not present

## 2016-08-20 DIAGNOSIS — Z961 Presence of intraocular lens: Secondary | ICD-10-CM | POA: Diagnosis not present

## 2016-08-20 DIAGNOSIS — H02831 Dermatochalasis of right upper eyelid: Secondary | ICD-10-CM | POA: Diagnosis not present

## 2016-08-20 DIAGNOSIS — E119 Type 2 diabetes mellitus without complications: Secondary | ICD-10-CM | POA: Diagnosis not present

## 2016-08-20 DIAGNOSIS — H02834 Dermatochalasis of left upper eyelid: Secondary | ICD-10-CM | POA: Diagnosis not present

## 2016-08-26 DIAGNOSIS — M5416 Radiculopathy, lumbar region: Secondary | ICD-10-CM | POA: Diagnosis not present

## 2016-08-26 DIAGNOSIS — M7061 Trochanteric bursitis, right hip: Secondary | ICD-10-CM | POA: Diagnosis not present

## 2016-08-26 DIAGNOSIS — M545 Low back pain: Secondary | ICD-10-CM | POA: Diagnosis not present

## 2016-08-27 IMAGING — CR DG NECK SOFT TISSUE
2 series · 2 of 2 positions shown · non-contrast
Comparison: None.

CLINICAL DATA: Choked on food tonight and feels like there is
something stuck in throat.

EXAM:
NECK SOFT TISSUES - 1+ VIEW

[w soft tissue neck lat (1 of 2)]
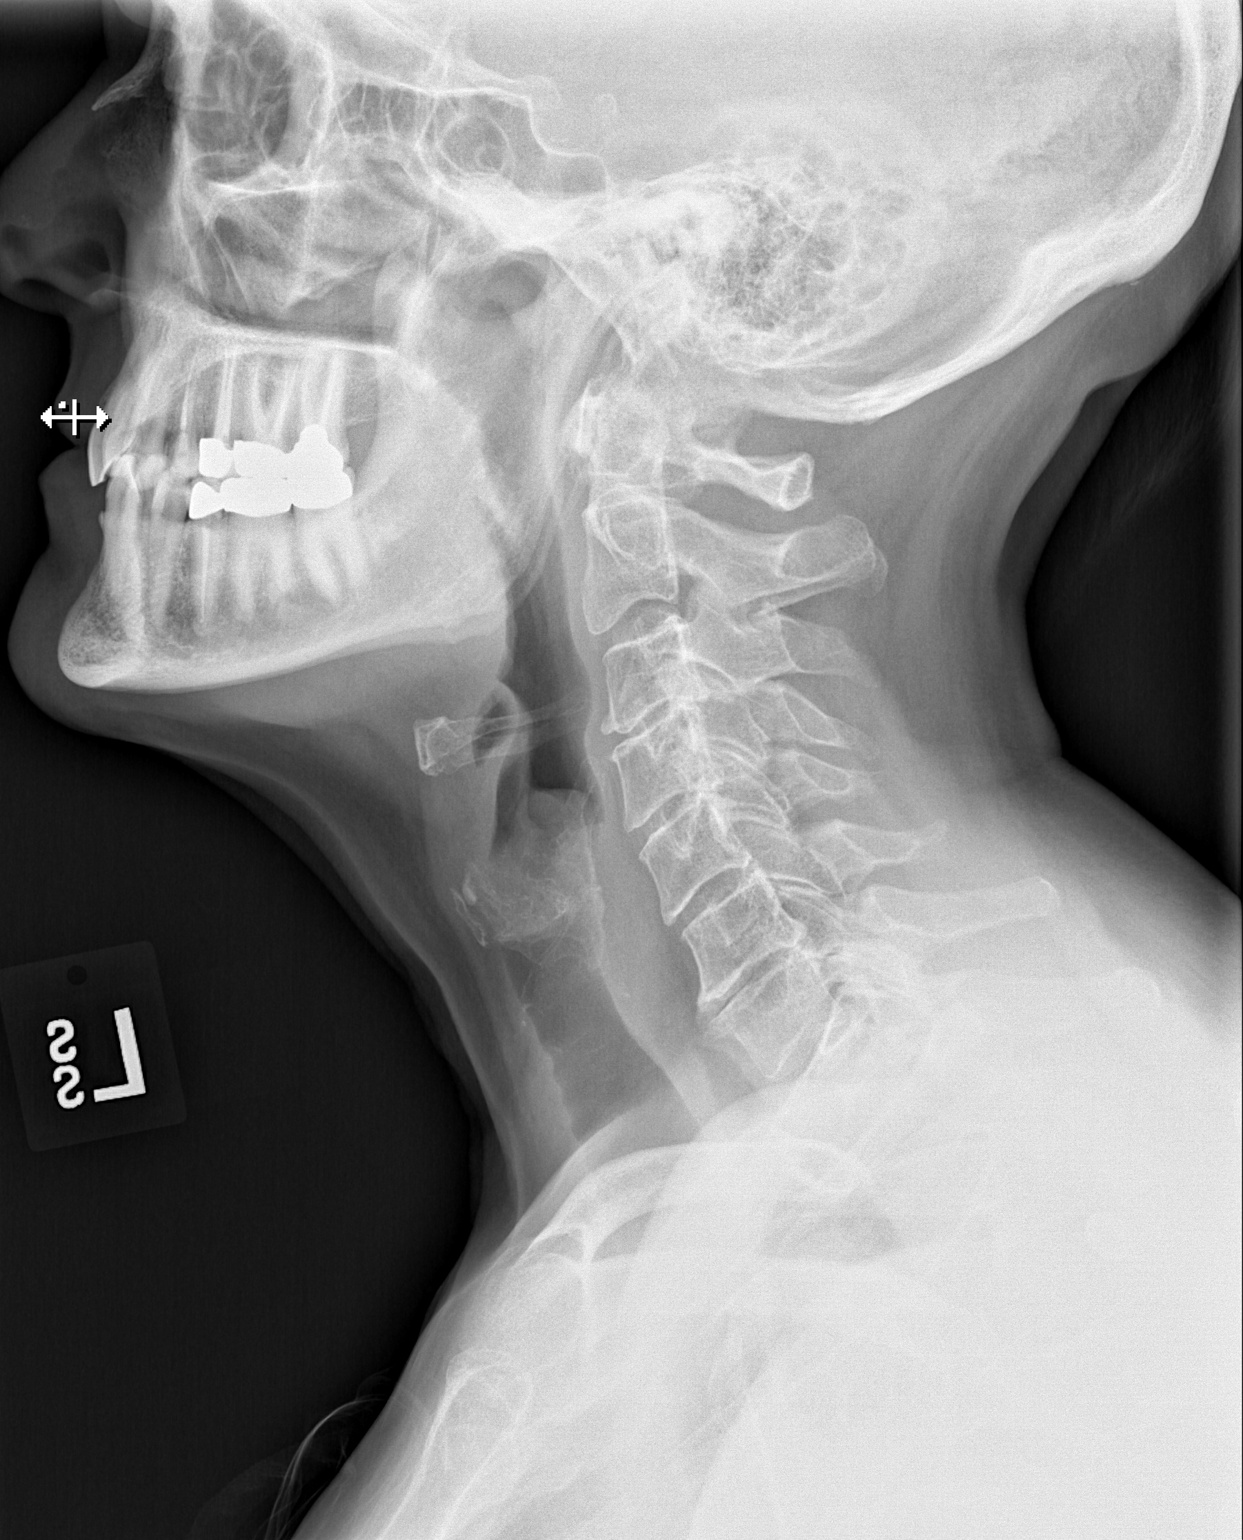

[w soft tissue neck lat (2 of 2)]
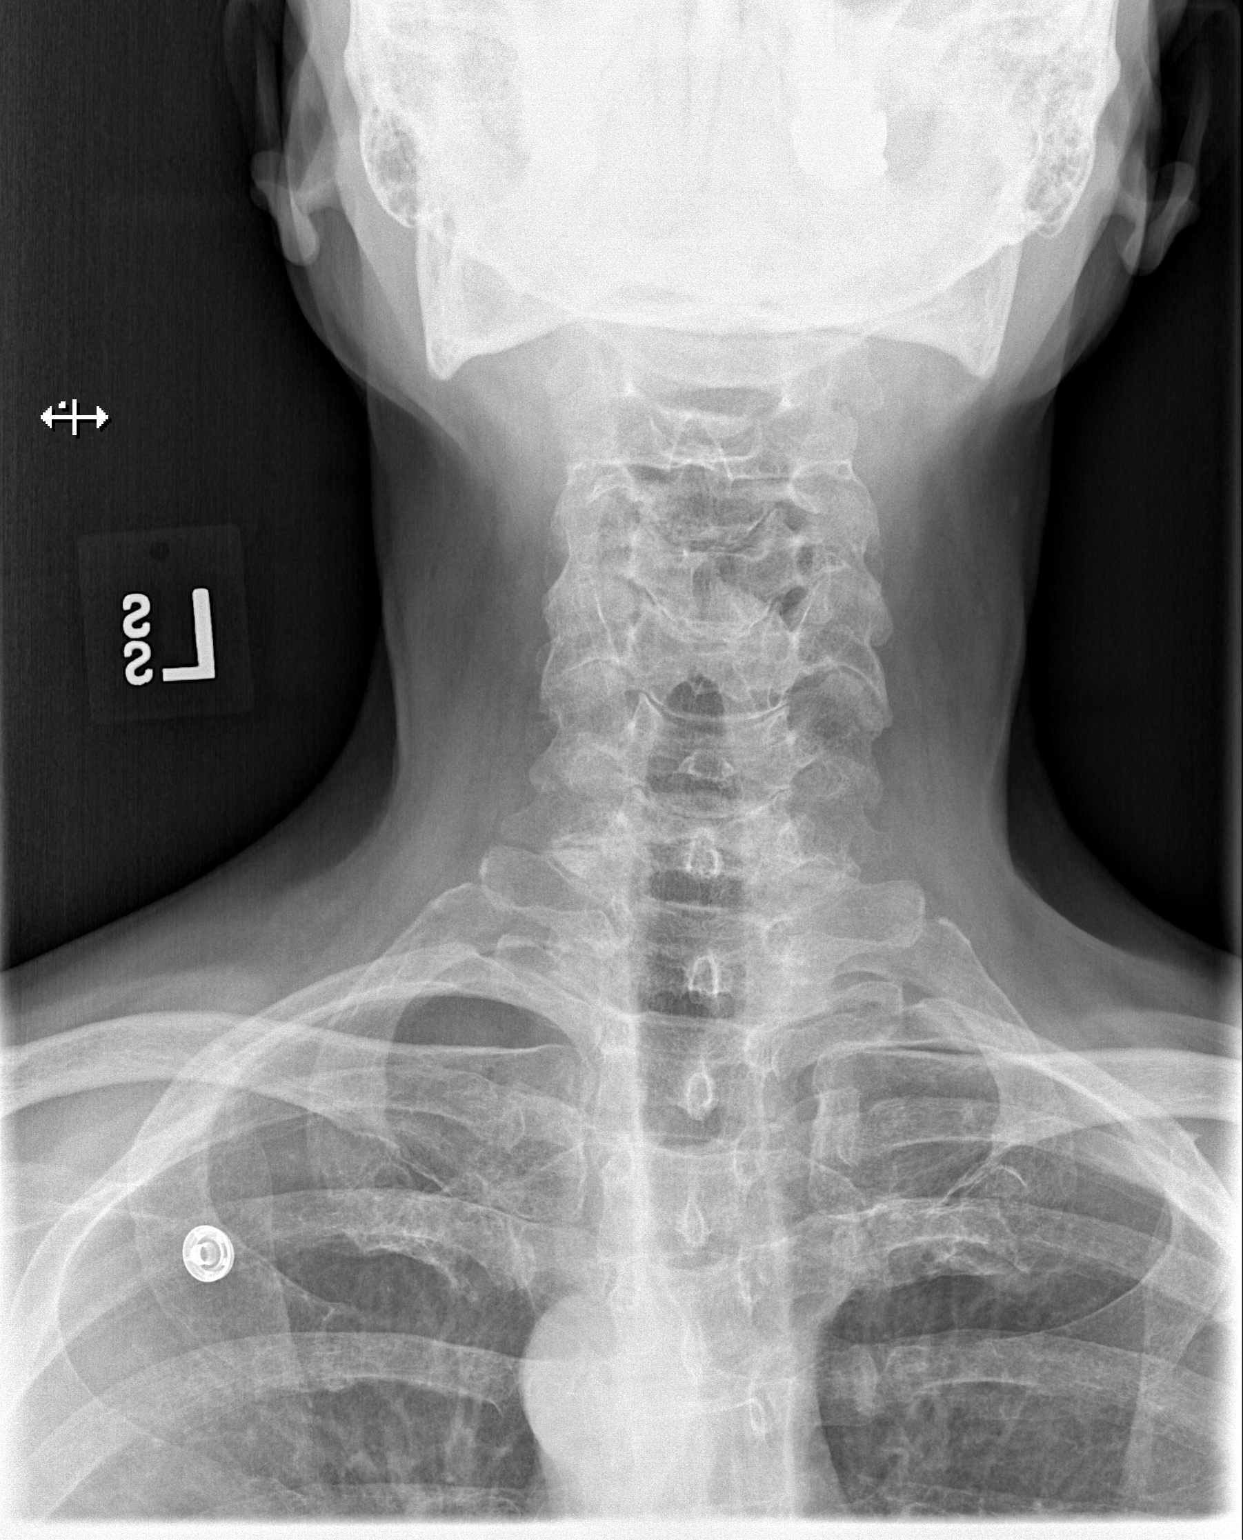

[2 of 2 positions shown; findings below may reference images not displayed]

FINDINGS: There is no evidence of retropharyngeal soft tissue swelling or
epiglottic enlargement. The cervical airway is unremarkable and no
radio-opaque foreign body identified.
IMPRESSION: Negative.

## 2016-09-14 DIAGNOSIS — R3915 Urgency of urination: Secondary | ICD-10-CM | POA: Diagnosis not present

## 2016-09-14 DIAGNOSIS — R8271 Bacteriuria: Secondary | ICD-10-CM | POA: Diagnosis not present

## 2016-09-28 DIAGNOSIS — Z96651 Presence of right artificial knee joint: Secondary | ICD-10-CM | POA: Diagnosis not present

## 2016-12-06 DIAGNOSIS — K219 Gastro-esophageal reflux disease without esophagitis: Secondary | ICD-10-CM | POA: Diagnosis not present

## 2016-12-06 DIAGNOSIS — F329 Major depressive disorder, single episode, unspecified: Secondary | ICD-10-CM | POA: Diagnosis not present

## 2016-12-06 DIAGNOSIS — R635 Abnormal weight gain: Secondary | ICD-10-CM | POA: Diagnosis not present

## 2016-12-06 DIAGNOSIS — E119 Type 2 diabetes mellitus without complications: Secondary | ICD-10-CM | POA: Diagnosis not present

## 2016-12-06 DIAGNOSIS — I10 Essential (primary) hypertension: Secondary | ICD-10-CM | POA: Diagnosis not present

## 2016-12-06 DIAGNOSIS — Z794 Long term (current) use of insulin: Secondary | ICD-10-CM | POA: Diagnosis not present

## 2016-12-06 DIAGNOSIS — Z23 Encounter for immunization: Secondary | ICD-10-CM | POA: Diagnosis not present

## 2016-12-10 DIAGNOSIS — E1165 Type 2 diabetes mellitus with hyperglycemia: Secondary | ICD-10-CM | POA: Diagnosis not present

## 2016-12-10 DIAGNOSIS — E0590 Thyrotoxicosis, unspecified without thyrotoxic crisis or storm: Secondary | ICD-10-CM | POA: Diagnosis not present

## 2016-12-10 DIAGNOSIS — I10 Essential (primary) hypertension: Secondary | ICD-10-CM | POA: Diagnosis not present

## 2016-12-17 DIAGNOSIS — E7800 Pure hypercholesterolemia, unspecified: Secondary | ICD-10-CM | POA: Diagnosis not present

## 2016-12-17 DIAGNOSIS — I10 Essential (primary) hypertension: Secondary | ICD-10-CM | POA: Diagnosis not present

## 2016-12-17 DIAGNOSIS — E0590 Thyrotoxicosis, unspecified without thyrotoxic crisis or storm: Secondary | ICD-10-CM | POA: Diagnosis not present

## 2016-12-17 DIAGNOSIS — E1165 Type 2 diabetes mellitus with hyperglycemia: Secondary | ICD-10-CM | POA: Diagnosis not present

## 2016-12-23 ENCOUNTER — Ambulatory Visit (INDEPENDENT_AMBULATORY_CARE_PROVIDER_SITE_OTHER): Payer: Medicare Other | Admitting: Sports Medicine

## 2016-12-23 ENCOUNTER — Encounter: Payer: Self-pay | Admitting: Sports Medicine

## 2016-12-23 DIAGNOSIS — M79672 Pain in left foot: Secondary | ICD-10-CM | POA: Diagnosis not present

## 2016-12-23 NOTE — Progress Notes (Signed)
   Subjective:    Patient ID: Sharon Ochoa, female    DOB: 1946/04/08, 71 y.o.   MRN: [id number]   CC: left ankle pain  HPI: 71 y/o F presents with left ankle pain  Left ankle pain - 71 has a history of chronic left ankle pain and several left ankle sprains in the pain - started to worsen 1 month ago - she has been in rehab since her knee replacement and she it was noted during one of her session that she has letf ankle pain and inability to stand on tip toe on that ankle - she denies and recent injuries or trauma - she sometimes feels the ankle is " tight" and is more difficult to " roll through" when walking than the right  Pertinent PMH- bilateral knee OA, s/p right total knee replacement, 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 Mild soft tissue swelling along the lateral malleolus. Positive anterior drawer, 2+ talar tilt. No tenderness to palpation along the posterior tibialis tendon nor along the peroneal tendons. Well-preserved arches. Normal calcaneal inversion with standing on tiptoes. Neurovascularly intact distally.   MSK ultrasound of the left ankle was performed. Limited images were obtained. She has a slight ankle effusion as well as fluid around the posterior tibialis tendon. Peroneal tendons are unremarkable.  Assessment & Plan:    Left foot pain Given [place] and exam findings and strong history of arthritis, her pain is likely secondary to arthritis in her foot - will clear her for continued exercise activities - will advise a sleeve over foot to use during activities    Salil Raineri A. Lincoln Brigham MD, [facility] Resident PGY-3 Pager [phone]-   Patient seen and evaluated with the resident. I agree with the above plan of care. Patient's left ankle pain is likely due to  osteoarthritis given the fact that she has osteoarthritis in other parts of her body. Her symptoms are currently tolerable. I recommended a simple compression sleeve with exercise. She could try compression sleeve on her left knee as well. If symptoms worsen then I would start with getting x-rays of her ankle. Of note, she was previously given green sports insoles with scaphoid pads and did not tolerate those so she is not a good candidate for orthotics. Follow-up as needed.

## 2016-12-23 NOTE — Assessment & Plan Note (Signed)
Given [place] and exam findings and strong history of arthritis, her pain is likely secondary to arthritis in her foot - will clear her for continued exercise activities - will advise a sleeve over foot to use during activities

## 2016-12-31 DIAGNOSIS — N3020 Other chronic cystitis without hematuria: Secondary | ICD-10-CM | POA: Diagnosis not present

## 2017-01-06 DIAGNOSIS — R42 Dizziness and giddiness: Secondary | ICD-10-CM | POA: Diagnosis not present

## 2017-01-06 DIAGNOSIS — E119 Type 2 diabetes mellitus without complications: Secondary | ICD-10-CM | POA: Diagnosis not present

## 2017-01-06 DIAGNOSIS — Z794 Long term (current) use of insulin: Secondary | ICD-10-CM | POA: Diagnosis not present

## 2017-01-19 DIAGNOSIS — N3020 Other chronic cystitis without hematuria: Secondary | ICD-10-CM | POA: Diagnosis not present

## 2017-01-19 DIAGNOSIS — N952 Postmenopausal atrophic vaginitis: Secondary | ICD-10-CM | POA: Diagnosis not present

## 2017-01-19 DIAGNOSIS — R3915 Urgency of urination: Secondary | ICD-10-CM | POA: Diagnosis not present

## 2017-01-25 DIAGNOSIS — H9192 Unspecified hearing loss, left ear: Secondary | ICD-10-CM | POA: Diagnosis not present

## 2017-01-27 DIAGNOSIS — H9192 Unspecified hearing loss, left ear: Secondary | ICD-10-CM | POA: Diagnosis not present

## 2017-01-27 DIAGNOSIS — H90A22 Sensorineural hearing loss, unilateral, left ear, with restricted hearing on the contralateral side: Secondary | ICD-10-CM | POA: Diagnosis not present

## 2017-01-27 DIAGNOSIS — R42 Dizziness and giddiness: Secondary | ICD-10-CM | POA: Diagnosis not present

## 2017-01-27 DIAGNOSIS — H9313 Tinnitus, bilateral: Secondary | ICD-10-CM | POA: Diagnosis not present

## 2017-01-27 DIAGNOSIS — H9122 Sudden idiopathic hearing loss, left ear: Secondary | ICD-10-CM | POA: Diagnosis not present

## 2017-01-27 DIAGNOSIS — H918X1 Other specified hearing loss, right ear: Secondary | ICD-10-CM | POA: Diagnosis not present

## 2017-02-10 DIAGNOSIS — H903 Sensorineural hearing loss, bilateral: Secondary | ICD-10-CM | POA: Diagnosis not present

## 2017-02-10 DIAGNOSIS — H9122 Sudden idiopathic hearing loss, left ear: Secondary | ICD-10-CM | POA: Diagnosis not present

## 2017-02-11 DIAGNOSIS — N3020 Other chronic cystitis without hematuria: Secondary | ICD-10-CM | POA: Diagnosis not present

## 2017-02-19 IMAGING — MR MR LUMBAR SPINE W/O CM
4 of 5 series · 22 of 48 positions shown · non-contrast
Comparison: CT abdomen and pelvis 07/19/2016

CLINICAL DATA: Right-sided low back pain and hip/leg pain with
occasional weakness for 5 months.

EXAM:
MRI LUMBAR SPINE WITHOUT CONTRAST
TECHNIQUE: Multiplanar, multisequence MR imaging of the lumbar spine was
performed. No intravenous contrast was administered.

[Series 3: T2 · sagittal · 4.0mm · 0.41mm/px · 6 of 12 slices shown (1 of 2)]
[im 1/12]
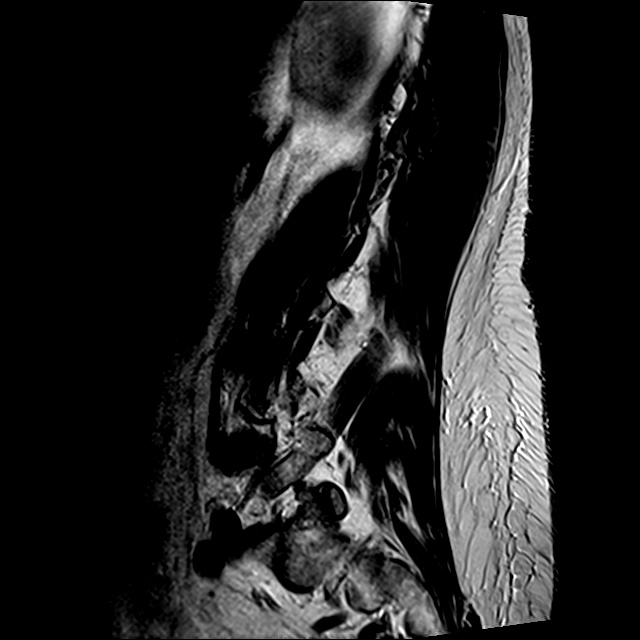
[im 3/12]
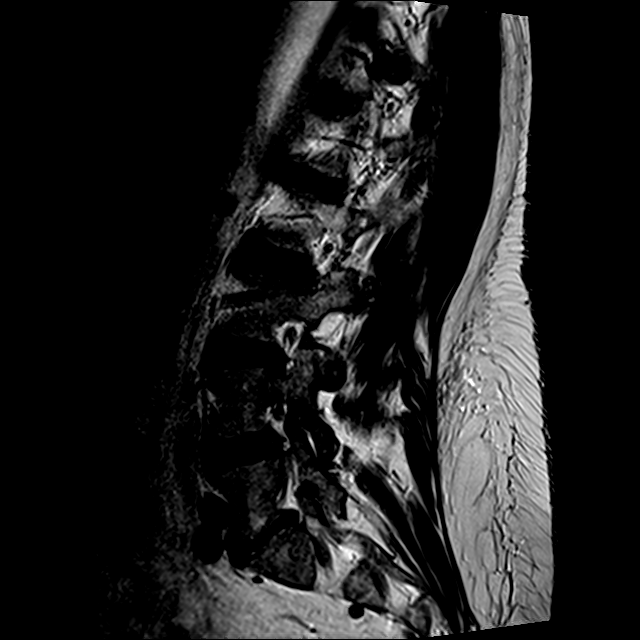
[im 5/12]
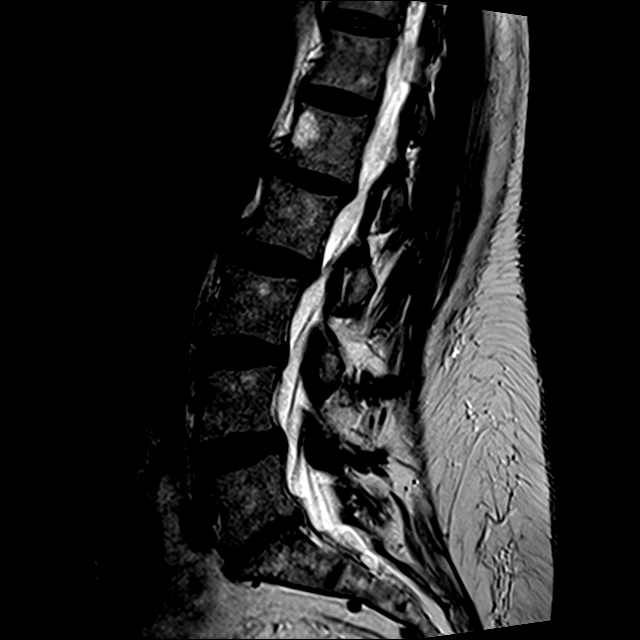
[im 7/12]
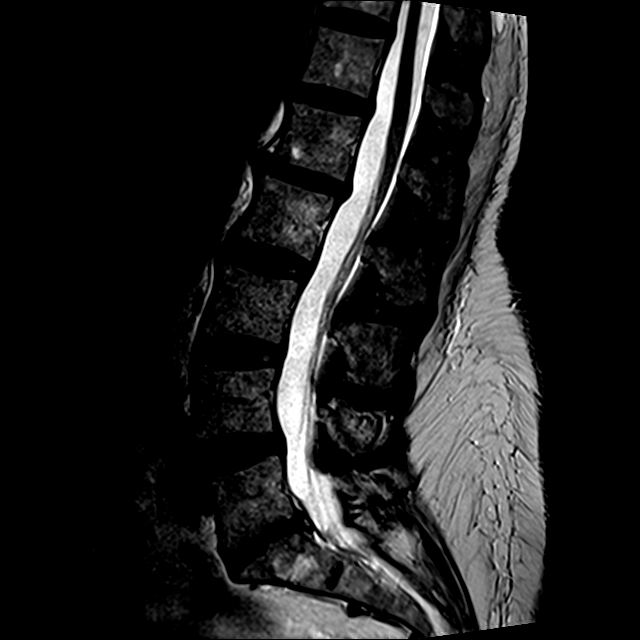
[im 9/12]
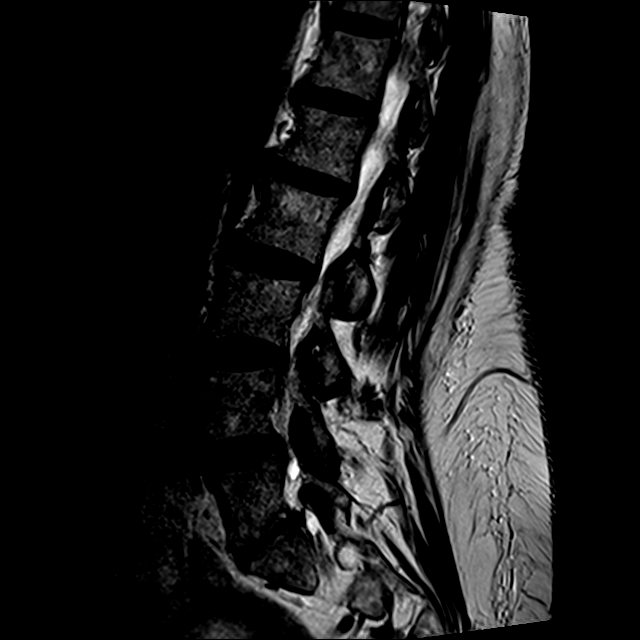
[im 12/12]
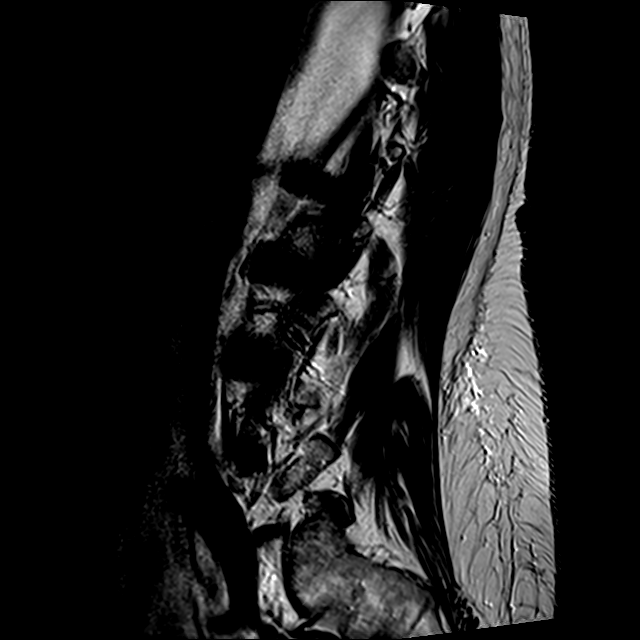

[Series 4: T1 · sagittal · 4.0mm · 0.51mm/px · 4 of 12 slices shown (1 of 2)]
[im 1/12]
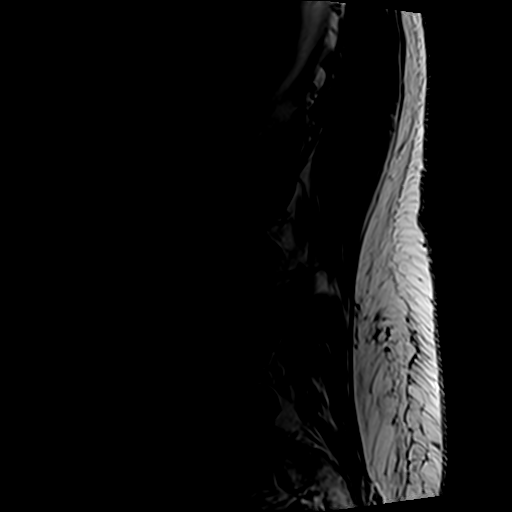
[im 3/12]
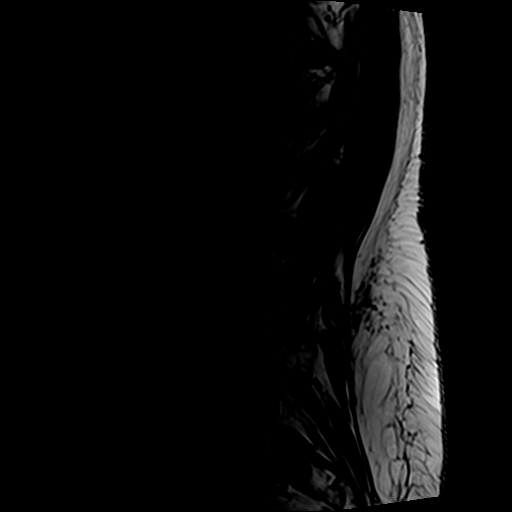
[im 7/12]
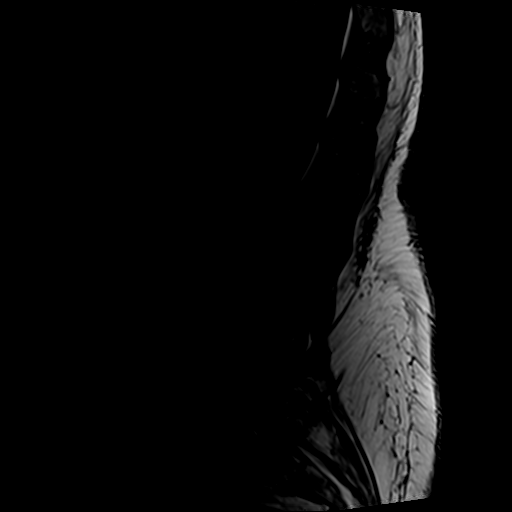
[im 12/12]
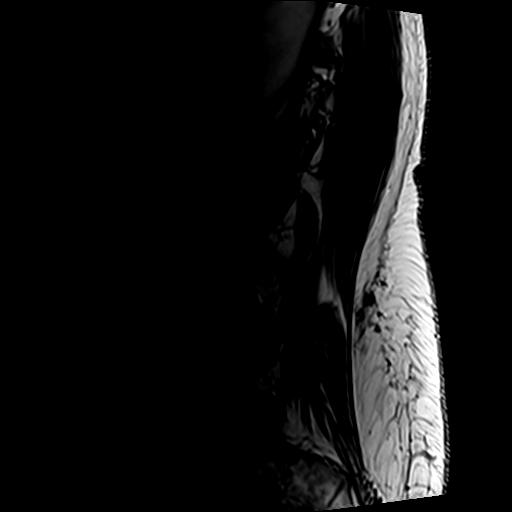

[Series 10: T2 · axial · 4.0mm · 0.70mm/px · z∈[-141,+70]mm · 9 of 32 slices shown (2 of 2)]
[im 1/32]
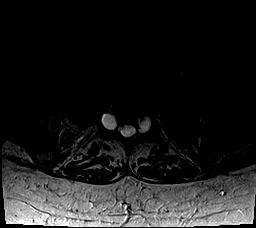
[im 5/32]
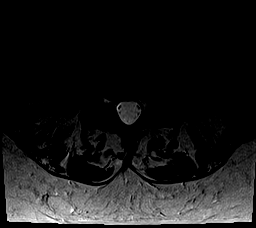
[im 9/32]
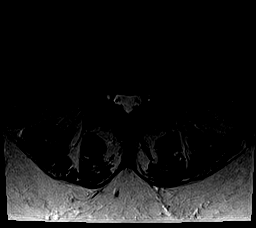
[im 14/32]
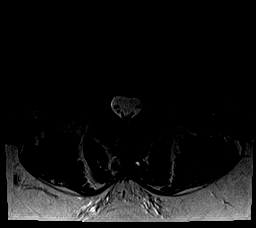
[im 16/32]
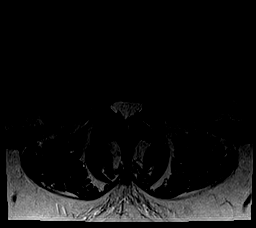
[im 18/32]
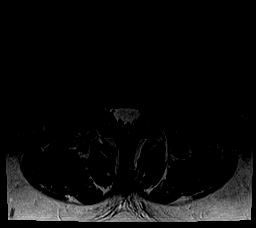
[im 23/32]
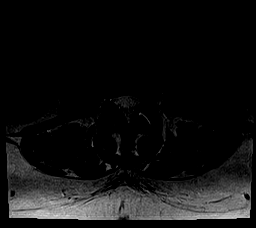
[im 27/32]
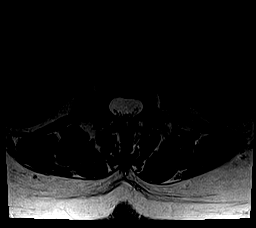
[im 32/32]
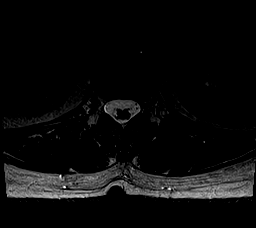

[Series 11: T1 · axial · 4.0mm · 0.35mm/px · z∈[-122,+26]mm · 3 of 32 slices shown (2 of 2)]
[im 5/32]
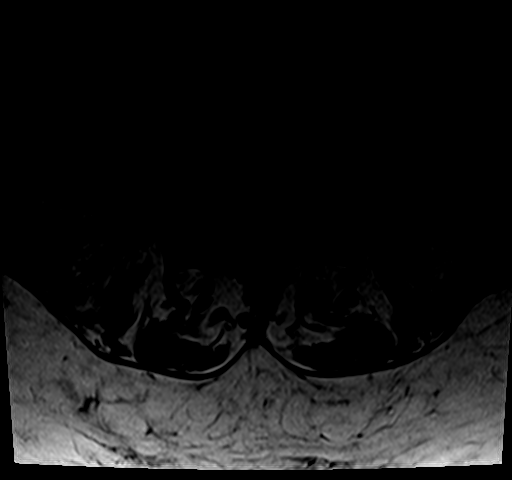
[im 16/32]
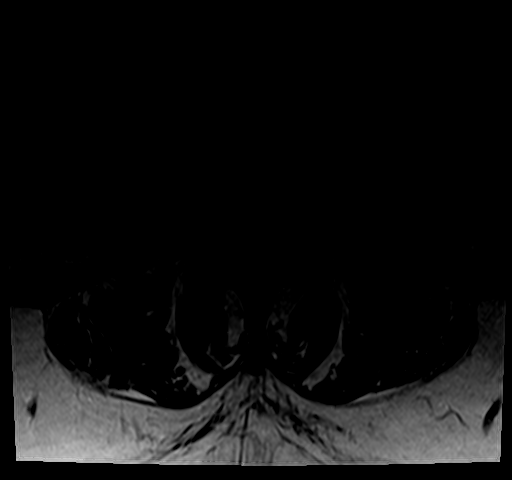
[im 27/32]
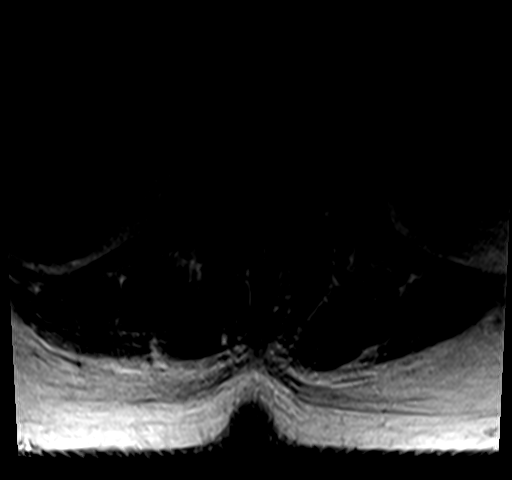

[22 of 48 positions shown; findings below may reference images not displayed]

FINDINGS: The study is mildly motion degraded due to patient pain despite
repeated imaging attempts.

Segmentation:  Standard.

Alignment: Trace retrolisthesis of L1 on L2 and L2 on L3, unchanged.

Vertebrae: Preserved vertebral body heights without evidence of
fracture. L1 vertebral body hemangioma. No significant marrow edema
identified.

Conus medullaris: Extends to the L1-2 level and appears normal.

Paraspinal and other soft tissues: Unremarkable.

Disc levels:

There is disc desiccation throughout the lumbar spine. Disc space
narrowing is mild at L1-2 and severe at L5-S1.

L1-2: Mild disc bulging results in minimal left lateral recess
narrowing without significant spinal stenosis or neural foraminal
stenosis.

L2-3: Mild disc bulging, left foraminal disc protrusion, and mild
facet hypertrophy result in mild left and minimal right lateral
recess stenosis without significant spinal stenosis or neural
foraminal stenosis.

L3-4: Minimal disc bulging and mild facet hypertrophy result in
minimal left lateral recess narrowing without spinal stenosis or
neural foraminal stenosis.

L4-5: Minimal disc bulging, left extraforaminal disc protrusion with
annular fissure, and mild-to-moderate facet and ligamentum flavum
hypertrophy result in mild left greater than right lateral recess
stenosis without spinal stenosis or neural foraminal stenosis.

L5-S1: Small central/right central disc protrusion, minimal disc
bulging, and mild facet hypertrophy result in mild right lateral
recess stenosis without spinal stenosis or neural foraminal
stenosis.
IMPRESSION: 1. Advanced L5-S1 disc degeneration with mild right lateral recess
stenosis.
2. Mild bilateral lateral recess stenosis at L4-5.

## 2017-02-24 DIAGNOSIS — Z96651 Presence of right artificial knee joint: Secondary | ICD-10-CM | POA: Diagnosis not present

## 2017-02-24 DIAGNOSIS — M545 Low back pain: Secondary | ICD-10-CM | POA: Diagnosis not present

## 2017-02-24 DIAGNOSIS — M25551 Pain in right hip: Secondary | ICD-10-CM | POA: Diagnosis not present

## 2017-02-28 DIAGNOSIS — N3021 Other chronic cystitis with hematuria: Secondary | ICD-10-CM | POA: Diagnosis not present

## 2017-02-28 DIAGNOSIS — N3020 Other chronic cystitis without hematuria: Secondary | ICD-10-CM | POA: Diagnosis not present

## 2017-03-07 DIAGNOSIS — N3020 Other chronic cystitis without hematuria: Secondary | ICD-10-CM | POA: Diagnosis not present

## 2017-03-07 DIAGNOSIS — R350 Frequency of micturition: Secondary | ICD-10-CM | POA: Diagnosis not present

## 2017-03-14 DIAGNOSIS — N3020 Other chronic cystitis without hematuria: Secondary | ICD-10-CM | POA: Diagnosis not present

## 2017-03-29 ENCOUNTER — Encounter: Payer: Self-pay | Admitting: Sports Medicine

## 2017-03-29 ENCOUNTER — Ambulatory Visit (INDEPENDENT_AMBULATORY_CARE_PROVIDER_SITE_OTHER): Payer: Medicare Other | Admitting: Sports Medicine

## 2017-03-29 VITALS — BP 150/80 | Ht 67.0 in | Wt 215.0 lb

## 2017-03-29 DIAGNOSIS — M2170 Unequal limb length (acquired), unspecified site: Secondary | ICD-10-CM

## 2017-03-30 DIAGNOSIS — N3020 Other chronic cystitis without hematuria: Secondary | ICD-10-CM | POA: Diagnosis not present

## 2017-03-30 NOTE — Progress Notes (Signed)
   Subjective:    Patient ID: Sharon Ochoa, female    DOB: [birth year], 71 y.o.   MRN: [id number]  HPI   Patient comes in today with persistent left foot pain. She believes that her left leg is shorter than her right leg ever since her right knee replacement done by Dr. Noemi Chapel. She has tried an off the shelf heel lift and found it to be helpful but it caused some arch pain. She did not like the compression sleeve that we gave her at her last visit. She has also not tolerated green sports insoles and scaphoid pads so we did not contemplate making custom orthotics for her.    Review of Systems As above    Objective:   Physical Exam  Well-developed, well-nourished. No acute distress  Left ankle: Full range of motion. Mild soft tissue swelling diffusely. She still has a positive anterior drawer and 2+ talar tilt which is chronic. Neurovascularly intact distally. Patient's left leg appears to be about half an inch shorter than her right leg.      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 She has a hard time finding something this comfortable. There is no doubt that she has a leg length discrepancy and needs a heel lift on the left. We've given her a 5/16 since lift for her to place in her shoes. Most of her shoes will not accommodate any sort of full length inserty. Follow-up with me in 4 weeks.

## 2017-05-03 ENCOUNTER — Ambulatory Visit (INDEPENDENT_AMBULATORY_CARE_PROVIDER_SITE_OTHER): Payer: Medicare Other | Admitting: Sports Medicine

## 2017-05-03 VITALS — BP 136/72

## 2017-05-03 DIAGNOSIS — M79672 Pain in left foot: Secondary | ICD-10-CM

## 2017-05-03 DIAGNOSIS — M2170 Unequal limb length (acquired), unspecified site: Secondary | ICD-10-CM

## 2017-05-03 NOTE — Progress Notes (Signed)
   Subjective:    Patient ID: Sharon Ochoa, female    DOB: [birth year], 71 y.o.   MRN: [id number]  HPI   Patient comes in today for follow-up. She did not tolerate the 5/16 inch heel lift. She is doing better with the 3/16 inch lift. Her main complaint is some pain along the medial right foot which has started to resolve over the past 4 days. Pain is present only when wearing certain shoes. No pain when walking barefoot. She currently has her 3/16 inch lift and a pair of sandals and these are the most comfortable shoes that she can wear. She wears her body helix compression sleeve intermittently for her ankle DJD. She finds it most comfortable when walking barefoot but gets pain when she wears it in a shoe.    Review of Systems As above    Objective:   Physical Exam  Well-developed, well-nourished. No acute distress  Right foot: There is slight tenderness to palpation just medial to the plantar fascia in the fat pad. No tenderness to palpation at the calcaneus. No pain with plantar fascial stretching. No soft tissue swelling. Neurovascularly intact distally. Walking without a limp.      Assessment & Plan:   Resolving right foot pain likely secondary to contusion from footwear/heel lift Leg length discrepancy  For now, we are going to simply stick with the smaller 3/16 inch lift in her sandal. I did give her an additional 3/16 inch lift to try in other shoes. She will soon be leaving for a 3 month stay in [place] so she will follow-up with me as needed.

## 2017-05-04 DIAGNOSIS — N3020 Other chronic cystitis without hematuria: Secondary | ICD-10-CM | POA: Diagnosis not present

## 2017-05-23 DIAGNOSIS — K219 Gastro-esophageal reflux disease without esophagitis: Secondary | ICD-10-CM | POA: Diagnosis not present

## 2017-05-23 DIAGNOSIS — E079 Disorder of thyroid, unspecified: Secondary | ICD-10-CM | POA: Diagnosis not present

## 2017-05-23 DIAGNOSIS — I10 Essential (primary) hypertension: Secondary | ICD-10-CM | POA: Diagnosis not present

## 2017-05-23 DIAGNOSIS — E7800 Pure hypercholesterolemia, unspecified: Secondary | ICD-10-CM | POA: Diagnosis not present

## 2017-05-23 DIAGNOSIS — Z794 Long term (current) use of insulin: Secondary | ICD-10-CM | POA: Diagnosis not present

## 2017-05-23 DIAGNOSIS — E119 Type 2 diabetes mellitus without complications: Secondary | ICD-10-CM | POA: Diagnosis not present

## 2017-05-26 ENCOUNTER — Other Ambulatory Visit: Payer: Self-pay | Admitting: Family Medicine

## 2017-05-26 DIAGNOSIS — Z1231 Encounter for screening mammogram for malignant neoplasm of breast: Secondary | ICD-10-CM

## 2017-08-26 ENCOUNTER — Ambulatory Visit
Admission: RE | Admit: 2017-08-26 | Discharge: 2017-08-26 | Disposition: A | Discharge status: Home / Self Care | Payer: Medicare Other | Source: Ambulatory Visit | Attending: Family Medicine | Admitting: Family Medicine

## 2017-08-26 DIAGNOSIS — Z1231 Encounter for screening mammogram for malignant neoplasm of breast: Secondary | ICD-10-CM | POA: Diagnosis not present

## 2017-08-29 DIAGNOSIS — I10 Essential (primary) hypertension: Secondary | ICD-10-CM | POA: Diagnosis not present

## 2017-08-29 DIAGNOSIS — N3020 Other chronic cystitis without hematuria: Secondary | ICD-10-CM | POA: Diagnosis not present

## 2017-08-29 DIAGNOSIS — N952 Postmenopausal atrophic vaginitis: Secondary | ICD-10-CM | POA: Diagnosis not present

## 2017-08-29 DIAGNOSIS — E079 Disorder of thyroid, unspecified: Secondary | ICD-10-CM | POA: Diagnosis not present

## 2017-08-29 DIAGNOSIS — Z23 Encounter for immunization: Secondary | ICD-10-CM | POA: Diagnosis not present

## 2017-08-29 DIAGNOSIS — F329 Major depressive disorder, single episode, unspecified: Secondary | ICD-10-CM | POA: Diagnosis not present

## 2017-08-29 DIAGNOSIS — E119 Type 2 diabetes mellitus without complications: Secondary | ICD-10-CM | POA: Diagnosis not present

## 2017-08-30 DIAGNOSIS — N3020 Other chronic cystitis without hematuria: Secondary | ICD-10-CM | POA: Diagnosis not present

## 2017-08-31 DIAGNOSIS — H02832 Dermatochalasis of right lower eyelid: Secondary | ICD-10-CM | POA: Diagnosis not present

## 2017-08-31 DIAGNOSIS — H02834 Dermatochalasis of left upper eyelid: Secondary | ICD-10-CM | POA: Diagnosis not present

## 2017-08-31 DIAGNOSIS — H02831 Dermatochalasis of right upper eyelid: Secondary | ICD-10-CM | POA: Diagnosis not present

## 2017-08-31 DIAGNOSIS — H43813 Vitreous degeneration, bilateral: Secondary | ICD-10-CM | POA: Diagnosis not present

## 2017-08-31 DIAGNOSIS — E119 Type 2 diabetes mellitus without complications: Secondary | ICD-10-CM | POA: Diagnosis not present

## 2017-08-31 DIAGNOSIS — H02835 Dermatochalasis of left lower eyelid: Secondary | ICD-10-CM | POA: Diagnosis not present

## 2017-08-31 DIAGNOSIS — Z961 Presence of intraocular lens: Secondary | ICD-10-CM | POA: Diagnosis not present

## 2017-09-08 DIAGNOSIS — N3020 Other chronic cystitis without hematuria: Secondary | ICD-10-CM | POA: Diagnosis not present

## 2017-09-26 DIAGNOSIS — R351 Nocturia: Secondary | ICD-10-CM | POA: Diagnosis not present

## 2017-09-26 DIAGNOSIS — Z87440 Personal history of urinary (tract) infections: Secondary | ICD-10-CM | POA: Diagnosis not present

## 2017-09-26 DIAGNOSIS — R350 Frequency of micturition: Secondary | ICD-10-CM | POA: Diagnosis not present

## 2017-09-28 DIAGNOSIS — N390 Urinary tract infection, site not specified: Secondary | ICD-10-CM | POA: Diagnosis not present

## 2017-10-05 DIAGNOSIS — N8111 Cystocele, midline: Secondary | ICD-10-CM | POA: Diagnosis not present

## 2017-10-05 DIAGNOSIS — N390 Urinary tract infection, site not specified: Secondary | ICD-10-CM | POA: Diagnosis not present

## 2017-10-07 DIAGNOSIS — N390 Urinary tract infection, site not specified: Secondary | ICD-10-CM | POA: Diagnosis not present

## 2017-10-12 DIAGNOSIS — Z789 Other specified health status: Secondary | ICD-10-CM | POA: Diagnosis not present

## 2017-10-12 DIAGNOSIS — G629 Polyneuropathy, unspecified: Secondary | ICD-10-CM | POA: Diagnosis not present

## 2017-10-12 DIAGNOSIS — E0590 Thyrotoxicosis, unspecified without thyrotoxic crisis or storm: Secondary | ICD-10-CM | POA: Diagnosis not present

## 2017-10-12 DIAGNOSIS — E119 Type 2 diabetes mellitus without complications: Secondary | ICD-10-CM | POA: Diagnosis not present

## 2017-10-14 DIAGNOSIS — N390 Urinary tract infection, site not specified: Secondary | ICD-10-CM | POA: Diagnosis not present

## 2017-10-14 DIAGNOSIS — N8111 Cystocele, midline: Secondary | ICD-10-CM | POA: Diagnosis not present

## 2017-11-01 DIAGNOSIS — Z87440 Personal history of urinary (tract) infections: Secondary | ICD-10-CM | POA: Diagnosis not present

## 2017-11-01 DIAGNOSIS — N8111 Cystocele, midline: Secondary | ICD-10-CM | POA: Diagnosis not present

## 2017-11-01 DIAGNOSIS — R3914 Feeling of incomplete bladder emptying: Secondary | ICD-10-CM | POA: Diagnosis not present

## 2017-11-04 DIAGNOSIS — N390 Urinary tract infection, site not specified: Secondary | ICD-10-CM | POA: Diagnosis not present

## 2017-11-06 DIAGNOSIS — N390 Urinary tract infection, site not specified: Secondary | ICD-10-CM | POA: Diagnosis not present

## 2017-11-07 DIAGNOSIS — J019 Acute sinusitis, unspecified: Secondary | ICD-10-CM | POA: Diagnosis not present

## 2017-11-10 DIAGNOSIS — J329 Chronic sinusitis, unspecified: Secondary | ICD-10-CM | POA: Diagnosis not present

## 2017-12-02 DIAGNOSIS — K5900 Constipation, unspecified: Secondary | ICD-10-CM | POA: Diagnosis not present

## 2017-12-02 DIAGNOSIS — N952 Postmenopausal atrophic vaginitis: Secondary | ICD-10-CM | POA: Diagnosis not present

## 2017-12-02 DIAGNOSIS — Z87440 Personal history of urinary (tract) infections: Secondary | ICD-10-CM | POA: Diagnosis not present

## 2017-12-05 DIAGNOSIS — M129 Arthropathy, unspecified: Secondary | ICD-10-CM | POA: Diagnosis not present

## 2017-12-05 DIAGNOSIS — Z9109 Other allergy status, other than to drugs and biological substances: Secondary | ICD-10-CM | POA: Diagnosis not present

## 2017-12-05 DIAGNOSIS — M1712 Unilateral primary osteoarthritis, left knee: Secondary | ICD-10-CM | POA: Diagnosis not present

## 2017-12-05 DIAGNOSIS — K219 Gastro-esophageal reflux disease without esophagitis: Secondary | ICD-10-CM | POA: Diagnosis not present

## 2017-12-05 DIAGNOSIS — E109 Type 1 diabetes mellitus without complications: Secondary | ICD-10-CM | POA: Diagnosis not present

## 2017-12-05 DIAGNOSIS — N951 Menopausal and female climacteric states: Secondary | ICD-10-CM | POA: Diagnosis not present

## 2017-12-05 DIAGNOSIS — I10 Essential (primary) hypertension: Secondary | ICD-10-CM | POA: Diagnosis not present

## 2017-12-12 DIAGNOSIS — M5136 Other intervertebral disc degeneration, lumbar region: Secondary | ICD-10-CM | POA: Diagnosis not present

## 2017-12-12 DIAGNOSIS — M1712 Unilateral primary osteoarthritis, left knee: Secondary | ICD-10-CM | POA: Diagnosis not present

## 2017-12-14 DIAGNOSIS — M25572 Pain in left ankle and joints of left foot: Secondary | ICD-10-CM | POA: Diagnosis not present

## 2017-12-14 DIAGNOSIS — M25562 Pain in left knee: Secondary | ICD-10-CM | POA: Diagnosis not present

## 2017-12-14 DIAGNOSIS — M25551 Pain in right hip: Secondary | ICD-10-CM | POA: Diagnosis not present

## 2017-12-14 DIAGNOSIS — M545 Low back pain: Secondary | ICD-10-CM | POA: Diagnosis not present

## 2017-12-16 DIAGNOSIS — M25551 Pain in right hip: Secondary | ICD-10-CM | POA: Diagnosis not present

## 2017-12-16 DIAGNOSIS — M25572 Pain in left ankle and joints of left foot: Secondary | ICD-10-CM | POA: Diagnosis not present

## 2017-12-16 DIAGNOSIS — M545 Low back pain: Secondary | ICD-10-CM | POA: Diagnosis not present

## 2017-12-16 DIAGNOSIS — M5136 Other intervertebral disc degeneration, lumbar region: Secondary | ICD-10-CM | POA: Diagnosis not present

## 2017-12-16 DIAGNOSIS — M25562 Pain in left knee: Secondary | ICD-10-CM | POA: Diagnosis not present

## 2017-12-19 DIAGNOSIS — M5136 Other intervertebral disc degeneration, lumbar region: Secondary | ICD-10-CM | POA: Diagnosis not present

## 2017-12-19 DIAGNOSIS — M25572 Pain in left ankle and joints of left foot: Secondary | ICD-10-CM | POA: Diagnosis not present

## 2017-12-19 DIAGNOSIS — M25562 Pain in left knee: Secondary | ICD-10-CM | POA: Diagnosis not present

## 2017-12-19 DIAGNOSIS — M25551 Pain in right hip: Secondary | ICD-10-CM | POA: Diagnosis not present

## 2017-12-19 DIAGNOSIS — M545 Low back pain: Secondary | ICD-10-CM | POA: Diagnosis not present

## 2017-12-23 DIAGNOSIS — M25551 Pain in right hip: Secondary | ICD-10-CM | POA: Diagnosis not present

## 2017-12-23 DIAGNOSIS — M25562 Pain in left knee: Secondary | ICD-10-CM | POA: Diagnosis not present

## 2017-12-23 DIAGNOSIS — M25572 Pain in left ankle and joints of left foot: Secondary | ICD-10-CM | POA: Diagnosis not present

## 2017-12-23 DIAGNOSIS — M5136 Other intervertebral disc degeneration, lumbar region: Secondary | ICD-10-CM | POA: Diagnosis not present

## 2017-12-23 DIAGNOSIS — M545 Low back pain: Secondary | ICD-10-CM | POA: Diagnosis not present

## 2017-12-26 DIAGNOSIS — M25572 Pain in left ankle and joints of left foot: Secondary | ICD-10-CM | POA: Diagnosis not present

## 2017-12-26 DIAGNOSIS — M5136 Other intervertebral disc degeneration, lumbar region: Secondary | ICD-10-CM | POA: Diagnosis not present

## 2017-12-26 DIAGNOSIS — M25551 Pain in right hip: Secondary | ICD-10-CM | POA: Diagnosis not present

## 2017-12-26 DIAGNOSIS — M25562 Pain in left knee: Secondary | ICD-10-CM | POA: Diagnosis not present

## 2017-12-26 DIAGNOSIS — M545 Low back pain: Secondary | ICD-10-CM | POA: Diagnosis not present

## 2017-12-29 DIAGNOSIS — M5136 Other intervertebral disc degeneration, lumbar region: Secondary | ICD-10-CM | POA: Diagnosis not present

## 2017-12-29 DIAGNOSIS — M545 Low back pain: Secondary | ICD-10-CM | POA: Diagnosis not present

## 2017-12-29 DIAGNOSIS — M25572 Pain in left ankle and joints of left foot: Secondary | ICD-10-CM | POA: Diagnosis not present

## 2017-12-29 DIAGNOSIS — M25551 Pain in right hip: Secondary | ICD-10-CM | POA: Diagnosis not present

## 2017-12-29 DIAGNOSIS — M25562 Pain in left knee: Secondary | ICD-10-CM | POA: Diagnosis not present

## 2018-01-05 DIAGNOSIS — M25551 Pain in right hip: Secondary | ICD-10-CM | POA: Diagnosis not present

## 2018-01-05 DIAGNOSIS — M5136 Other intervertebral disc degeneration, lumbar region: Secondary | ICD-10-CM | POA: Diagnosis not present

## 2018-01-05 DIAGNOSIS — M545 Low back pain: Secondary | ICD-10-CM | POA: Diagnosis not present

## 2018-01-05 DIAGNOSIS — M25562 Pain in left knee: Secondary | ICD-10-CM | POA: Diagnosis not present

## 2018-01-05 DIAGNOSIS — M25572 Pain in left ankle and joints of left foot: Secondary | ICD-10-CM | POA: Diagnosis not present

## 2018-01-10 DIAGNOSIS — E109 Type 1 diabetes mellitus without complications: Secondary | ICD-10-CM | POA: Diagnosis not present

## 2018-01-11 DIAGNOSIS — E109 Type 1 diabetes mellitus without complications: Secondary | ICD-10-CM | POA: Diagnosis not present

## 2018-01-12 DIAGNOSIS — M25551 Pain in right hip: Secondary | ICD-10-CM | POA: Diagnosis not present

## 2018-01-12 DIAGNOSIS — M545 Low back pain: Secondary | ICD-10-CM | POA: Diagnosis not present

## 2018-01-12 DIAGNOSIS — M5136 Other intervertebral disc degeneration, lumbar region: Secondary | ICD-10-CM | POA: Diagnosis not present

## 2018-01-12 DIAGNOSIS — M25562 Pain in left knee: Secondary | ICD-10-CM | POA: Diagnosis not present

## 2018-01-12 DIAGNOSIS — M25572 Pain in left ankle and joints of left foot: Secondary | ICD-10-CM | POA: Diagnosis not present

## 2018-02-09 DIAGNOSIS — M545 Low back pain: Secondary | ICD-10-CM | POA: Diagnosis not present

## 2018-02-09 DIAGNOSIS — M25562 Pain in left knee: Secondary | ICD-10-CM | POA: Diagnosis not present

## 2018-02-09 DIAGNOSIS — M5136 Other intervertebral disc degeneration, lumbar region: Secondary | ICD-10-CM | POA: Diagnosis not present

## 2018-02-09 DIAGNOSIS — M25572 Pain in left ankle and joints of left foot: Secondary | ICD-10-CM | POA: Diagnosis not present

## 2018-02-09 DIAGNOSIS — M25551 Pain in right hip: Secondary | ICD-10-CM | POA: Diagnosis not present

## 2018-02-10 DIAGNOSIS — E109 Type 1 diabetes mellitus without complications: Secondary | ICD-10-CM | POA: Diagnosis not present

## 2018-02-13 DIAGNOSIS — M1712 Unilateral primary osteoarthritis, left knee: Secondary | ICD-10-CM | POA: Diagnosis not present

## 2018-03-08 DIAGNOSIS — M1712 Unilateral primary osteoarthritis, left knee: Secondary | ICD-10-CM | POA: Diagnosis not present

## 2018-04-19 DIAGNOSIS — N390 Urinary tract infection, site not specified: Secondary | ICD-10-CM | POA: Diagnosis not present

## 2018-04-20 DIAGNOSIS — E109 Type 1 diabetes mellitus without complications: Secondary | ICD-10-CM | POA: Diagnosis not present

## 2018-05-29 DIAGNOSIS — Z87440 Personal history of urinary (tract) infections: Secondary | ICD-10-CM | POA: Diagnosis not present

## 2018-06-08 DIAGNOSIS — M1712 Unilateral primary osteoarthritis, left knee: Secondary | ICD-10-CM | POA: Diagnosis not present

## 2018-06-12 DIAGNOSIS — E7800 Pure hypercholesterolemia, unspecified: Secondary | ICD-10-CM | POA: Diagnosis not present

## 2018-06-12 DIAGNOSIS — E1069 Type 1 diabetes mellitus with other specified complication: Secondary | ICD-10-CM | POA: Diagnosis not present

## 2018-06-12 DIAGNOSIS — I10 Essential (primary) hypertension: Secondary | ICD-10-CM | POA: Diagnosis not present

## 2018-06-12 DIAGNOSIS — K219 Gastro-esophageal reflux disease without esophagitis: Secondary | ICD-10-CM | POA: Diagnosis not present

## 2018-06-12 DIAGNOSIS — Z794 Long term (current) use of insulin: Secondary | ICD-10-CM | POA: Diagnosis not present

## 2018-06-12 DIAGNOSIS — N390 Urinary tract infection, site not specified: Secondary | ICD-10-CM | POA: Diagnosis not present

## 2018-06-12 DIAGNOSIS — M129 Arthropathy, unspecified: Secondary | ICD-10-CM | POA: Diagnosis not present

## 2018-07-11 DIAGNOSIS — N390 Urinary tract infection, site not specified: Secondary | ICD-10-CM | POA: Diagnosis not present

## 2018-07-17 DIAGNOSIS — R809 Proteinuria, unspecified: Secondary | ICD-10-CM | POA: Diagnosis not present

## 2018-07-17 DIAGNOSIS — E871 Hypo-osmolality and hyponatremia: Secondary | ICD-10-CM | POA: Diagnosis not present

## 2018-07-17 DIAGNOSIS — R7989 Other specified abnormal findings of blood chemistry: Secondary | ICD-10-CM | POA: Diagnosis not present

## 2018-07-18 DIAGNOSIS — I10 Essential (primary) hypertension: Secondary | ICD-10-CM | POA: Diagnosis not present

## 2018-07-18 DIAGNOSIS — Z794 Long term (current) use of insulin: Secondary | ICD-10-CM | POA: Diagnosis not present

## 2018-07-18 DIAGNOSIS — N390 Urinary tract infection, site not specified: Secondary | ICD-10-CM | POA: Diagnosis not present

## 2018-07-18 DIAGNOSIS — E1069 Type 1 diabetes mellitus with other specified complication: Secondary | ICD-10-CM | POA: Diagnosis not present

## 2018-07-20 DIAGNOSIS — N390 Urinary tract infection, site not specified: Secondary | ICD-10-CM | POA: Diagnosis not present

## 2018-07-22 DIAGNOSIS — N390 Urinary tract infection, site not specified: Secondary | ICD-10-CM | POA: Diagnosis not present

## 2018-07-23 DIAGNOSIS — N390 Urinary tract infection, site not specified: Secondary | ICD-10-CM | POA: Diagnosis not present

## 2018-07-24 DIAGNOSIS — E109 Type 1 diabetes mellitus without complications: Secondary | ICD-10-CM | POA: Diagnosis not present

## 2018-07-28 DIAGNOSIS — N390 Urinary tract infection, site not specified: Secondary | ICD-10-CM | POA: Diagnosis not present

## 2018-07-28 DIAGNOSIS — Z01810 Encounter for preprocedural cardiovascular examination: Secondary | ICD-10-CM | POA: Diagnosis not present

## 2018-07-31 DIAGNOSIS — N3091 Cystitis, unspecified with hematuria: Secondary | ICD-10-CM | POA: Diagnosis not present

## 2018-07-31 DIAGNOSIS — D414 Neoplasm of uncertain behavior of bladder: Secondary | ICD-10-CM | POA: Diagnosis not present

## 2018-07-31 DIAGNOSIS — N3289 Other specified disorders of bladder: Secondary | ICD-10-CM | POA: Diagnosis not present

## 2018-07-31 DIAGNOSIS — Z01810 Encounter for preprocedural cardiovascular examination: Secondary | ICD-10-CM | POA: Diagnosis not present

## 2018-07-31 DIAGNOSIS — N390 Urinary tract infection, site not specified: Secondary | ICD-10-CM | POA: Diagnosis not present

## 2018-07-31 DIAGNOSIS — Z01818 Encounter for other preprocedural examination: Secondary | ICD-10-CM | POA: Diagnosis not present

## 2018-08-01 DIAGNOSIS — Z01810 Encounter for preprocedural cardiovascular examination: Secondary | ICD-10-CM | POA: Diagnosis not present

## 2018-08-07 DIAGNOSIS — N2889 Other specified disorders of kidney and ureter: Secondary | ICD-10-CM | POA: Diagnosis not present

## 2018-08-07 DIAGNOSIS — R319 Hematuria, unspecified: Secondary | ICD-10-CM | POA: Diagnosis not present

## 2018-08-07 DIAGNOSIS — N3020 Other chronic cystitis without hematuria: Secondary | ICD-10-CM | POA: Diagnosis not present

## 2018-08-07 DIAGNOSIS — N3289 Other specified disorders of bladder: Secondary | ICD-10-CM | POA: Diagnosis not present

## 2018-08-07 DIAGNOSIS — N329 Bladder disorder, unspecified: Secondary | ICD-10-CM | POA: Diagnosis not present

## 2018-08-14 DIAGNOSIS — N390 Urinary tract infection, site not specified: Secondary | ICD-10-CM | POA: Diagnosis not present

## 2018-08-14 DIAGNOSIS — Z87440 Personal history of urinary (tract) infections: Secondary | ICD-10-CM | POA: Diagnosis not present

## 2018-08-19 DIAGNOSIS — N390 Urinary tract infection, site not specified: Secondary | ICD-10-CM | POA: Diagnosis not present

## 2018-08-22 DIAGNOSIS — K219 Gastro-esophageal reflux disease without esophagitis: Secondary | ICD-10-CM | POA: Diagnosis not present

## 2018-08-22 DIAGNOSIS — E1069 Type 1 diabetes mellitus with other specified complication: Secondary | ICD-10-CM | POA: Diagnosis not present

## 2018-08-22 DIAGNOSIS — R5383 Other fatigue: Secondary | ICD-10-CM | POA: Diagnosis not present

## 2018-08-22 DIAGNOSIS — Z23 Encounter for immunization: Secondary | ICD-10-CM | POA: Diagnosis not present

## 2018-08-22 DIAGNOSIS — I10 Essential (primary) hypertension: Secondary | ICD-10-CM | POA: Diagnosis not present

## 2018-08-22 DIAGNOSIS — E871 Hypo-osmolality and hyponatremia: Secondary | ICD-10-CM | POA: Diagnosis not present

## 2018-08-22 DIAGNOSIS — R7989 Other specified abnormal findings of blood chemistry: Secondary | ICD-10-CM | POA: Diagnosis not present

## 2018-08-22 DIAGNOSIS — N390 Urinary tract infection, site not specified: Secondary | ICD-10-CM | POA: Diagnosis not present

## 2018-08-31 DIAGNOSIS — N390 Urinary tract infection, site not specified: Secondary | ICD-10-CM | POA: Diagnosis not present

## 2018-09-14 DIAGNOSIS — N390 Urinary tract infection, site not specified: Secondary | ICD-10-CM | POA: Diagnosis not present

## 2018-09-14 DIAGNOSIS — I10 Essential (primary) hypertension: Secondary | ICD-10-CM | POA: Diagnosis not present

## 2018-09-14 DIAGNOSIS — N183 Chronic kidney disease, stage 3 (moderate): Secondary | ICD-10-CM | POA: Diagnosis not present

## 2018-09-14 DIAGNOSIS — N179 Acute kidney failure, unspecified: Secondary | ICD-10-CM | POA: Diagnosis not present

## 2018-09-19 DIAGNOSIS — Z87440 Personal history of urinary (tract) infections: Secondary | ICD-10-CM | POA: Diagnosis not present

## 2018-09-19 DIAGNOSIS — N390 Urinary tract infection, site not specified: Secondary | ICD-10-CM | POA: Diagnosis not present

## 2018-09-19 DIAGNOSIS — N3020 Other chronic cystitis without hematuria: Secondary | ICD-10-CM | POA: Diagnosis not present

## 2018-09-25 DIAGNOSIS — N179 Acute kidney failure, unspecified: Secondary | ICD-10-CM | POA: Diagnosis not present

## 2018-09-27 DIAGNOSIS — Z961 Presence of intraocular lens: Secondary | ICD-10-CM | POA: Diagnosis not present

## 2018-09-27 DIAGNOSIS — E109 Type 1 diabetes mellitus without complications: Secondary | ICD-10-CM | POA: Diagnosis not present

## 2018-09-27 DIAGNOSIS — H43813 Vitreous degeneration, bilateral: Secondary | ICD-10-CM | POA: Diagnosis not present

## 2018-10-05 DIAGNOSIS — N3020 Other chronic cystitis without hematuria: Secondary | ICD-10-CM | POA: Diagnosis not present

## 2018-10-05 DIAGNOSIS — R310 Gross hematuria: Secondary | ICD-10-CM | POA: Diagnosis not present

## 2018-10-05 DIAGNOSIS — N3011 Interstitial cystitis (chronic) with hematuria: Secondary | ICD-10-CM | POA: Diagnosis not present

## 2018-10-26 DIAGNOSIS — E109 Type 1 diabetes mellitus without complications: Secondary | ICD-10-CM | POA: Diagnosis not present

## 2018-10-26 DIAGNOSIS — N179 Acute kidney failure, unspecified: Secondary | ICD-10-CM | POA: Diagnosis not present

## 2018-10-26 DIAGNOSIS — H8112 Benign paroxysmal vertigo, left ear: Secondary | ICD-10-CM | POA: Diagnosis not present

## 2018-11-01 DIAGNOSIS — Z794 Long term (current) use of insulin: Secondary | ICD-10-CM | POA: Diagnosis not present

## 2018-11-01 DIAGNOSIS — E7800 Pure hypercholesterolemia, unspecified: Secondary | ICD-10-CM | POA: Diagnosis not present

## 2018-11-01 DIAGNOSIS — K219 Gastro-esophageal reflux disease without esophagitis: Secondary | ICD-10-CM | POA: Diagnosis not present

## 2018-11-01 DIAGNOSIS — H8112 Benign paroxysmal vertigo, left ear: Secondary | ICD-10-CM | POA: Diagnosis not present

## 2018-11-01 DIAGNOSIS — N390 Urinary tract infection, site not specified: Secondary | ICD-10-CM | POA: Diagnosis not present

## 2018-11-01 DIAGNOSIS — M129 Arthropathy, unspecified: Secondary | ICD-10-CM | POA: Diagnosis not present

## 2018-11-01 DIAGNOSIS — E1069 Type 1 diabetes mellitus with other specified complication: Secondary | ICD-10-CM | POA: Diagnosis not present

## 2018-11-01 DIAGNOSIS — I10 Essential (primary) hypertension: Secondary | ICD-10-CM | POA: Diagnosis not present

## 2018-11-03 DIAGNOSIS — N179 Acute kidney failure, unspecified: Secondary | ICD-10-CM | POA: Diagnosis not present

## 2018-11-03 DIAGNOSIS — N183 Chronic kidney disease, stage 3 (moderate): Secondary | ICD-10-CM | POA: Diagnosis not present

## 2018-11-14 DIAGNOSIS — N179 Acute kidney failure, unspecified: Secondary | ICD-10-CM | POA: Diagnosis not present

## 2018-11-14 DIAGNOSIS — N183 Chronic kidney disease, stage 3 (moderate): Secondary | ICD-10-CM | POA: Diagnosis not present

## 2018-11-16 DIAGNOSIS — M1712 Unilateral primary osteoarthritis, left knee: Secondary | ICD-10-CM | POA: Diagnosis not present

## 2018-11-28 DIAGNOSIS — Z87440 Personal history of urinary (tract) infections: Secondary | ICD-10-CM | POA: Diagnosis not present

## 2018-11-30 DIAGNOSIS — E109 Type 1 diabetes mellitus without complications: Secondary | ICD-10-CM | POA: Diagnosis not present

## 2019-01-04 DIAGNOSIS — N390 Urinary tract infection, site not specified: Secondary | ICD-10-CM | POA: Diagnosis not present

## 2019-01-06 DIAGNOSIS — N390 Urinary tract infection, site not specified: Secondary | ICD-10-CM | POA: Diagnosis not present

## 2019-01-08 DIAGNOSIS — N183 Chronic kidney disease, stage 3 (moderate): Secondary | ICD-10-CM | POA: Diagnosis not present

## 2019-01-08 DIAGNOSIS — E1022 Type 1 diabetes mellitus with diabetic chronic kidney disease: Secondary | ICD-10-CM | POA: Diagnosis not present

## 2019-01-08 DIAGNOSIS — I493 Ventricular premature depolarization: Secondary | ICD-10-CM | POA: Diagnosis not present

## 2019-01-08 DIAGNOSIS — N179 Acute kidney failure, unspecified: Secondary | ICD-10-CM | POA: Diagnosis not present

## 2019-01-08 DIAGNOSIS — Z01810 Encounter for preprocedural cardiovascular examination: Secondary | ICD-10-CM | POA: Diagnosis not present

## 2019-01-08 DIAGNOSIS — I10 Essential (primary) hypertension: Secondary | ICD-10-CM | POA: Diagnosis not present

## 2019-01-31 DIAGNOSIS — N390 Urinary tract infection, site not specified: Secondary | ICD-10-CM | POA: Diagnosis not present

## 2019-02-02 DIAGNOSIS — N390 Urinary tract infection, site not specified: Secondary | ICD-10-CM | POA: Diagnosis not present
